# Patient Record
Sex: Male | Born: 1970 | Race: White | Hispanic: No | Marital: Married | Smoking: Former smoker
Health system: Southern US, Community
[De-identification: ages and names within clinical notes are randomized; demographics above are authoritative.]

## PROBLEM LIST (undated history)

## (undated) DIAGNOSIS — E119 Type 2 diabetes mellitus without complications: Secondary | ICD-10-CM

## (undated) DIAGNOSIS — N529 Male erectile dysfunction, unspecified: Secondary | ICD-10-CM

## (undated) DIAGNOSIS — R12 Heartburn: Secondary | ICD-10-CM

## (undated) DIAGNOSIS — G4730 Sleep apnea, unspecified: Secondary | ICD-10-CM

## (undated) HISTORY — DX: Sleep apnea, unspecified: G47.30

## (undated) HISTORY — PX: VASECTOMY: SHX75

## (undated) HISTORY — DX: Male erectile dysfunction, unspecified: N52.9

## (undated) HISTORY — DX: Heartburn: R12

## (undated) HISTORY — DX: Type 2 diabetes mellitus without complications: E11.9

---

## 2016-09-04 ENCOUNTER — Ambulatory Visit (INDEPENDENT_AMBULATORY_CARE_PROVIDER_SITE_OTHER): Payer: BC Managed Care – PPO | Admitting: Urology

## 2016-09-04 DIAGNOSIS — N5201 Erectile dysfunction due to arterial insufficiency: Secondary | ICD-10-CM | POA: Diagnosis not present

## 2016-09-04 DIAGNOSIS — Z302 Encounter for sterilization: Secondary | ICD-10-CM | POA: Diagnosis not present

## 2016-09-25 ENCOUNTER — Encounter: Payer: Self-pay | Admitting: *Deleted

## 2016-09-26 ENCOUNTER — Encounter: Payer: Self-pay | Admitting: Cardiovascular Disease

## 2016-09-26 ENCOUNTER — Ambulatory Visit (INDEPENDENT_AMBULATORY_CARE_PROVIDER_SITE_OTHER): Payer: BC Managed Care – PPO | Admitting: Cardiovascular Disease

## 2016-09-26 VITALS — BP 132/84 | HR 84 | Ht 71.0 in | Wt 243.0 lb

## 2016-09-26 DIAGNOSIS — Z7182 Exercise counseling: Secondary | ICD-10-CM | POA: Diagnosis not present

## 2016-09-26 DIAGNOSIS — E119 Type 2 diabetes mellitus without complications: Secondary | ICD-10-CM

## 2016-09-26 DIAGNOSIS — N521 Erectile dysfunction due to diseases classified elsewhere: Secondary | ICD-10-CM

## 2016-09-26 DIAGNOSIS — E782 Mixed hyperlipidemia: Secondary | ICD-10-CM | POA: Diagnosis not present

## 2016-09-26 DIAGNOSIS — Z136 Encounter for screening for cardiovascular disorders: Secondary | ICD-10-CM | POA: Diagnosis not present

## 2016-09-26 DIAGNOSIS — Z9189 Other specified personal risk factors, not elsewhere classified: Secondary | ICD-10-CM | POA: Diagnosis not present

## 2016-09-26 NOTE — Patient Instructions (Signed)
Medication Instructions:  Continue all current medications.  Labwork: none  Testing/Procedures: none  Follow-Up: As needed.    Any Other Special Instructions Will Be Listed Below (If Applicable).  If you need a refill on your cardiac medications before your next appointment, please call your pharmacy.  

## 2016-09-26 NOTE — Progress Notes (Signed)
CARDIOLOGY CONSULT NOTE  Patient ID: Luis York MRN: [id number] DOB/AGE: Jul 08, 1970 46 y.o.  Admit date: (Not on file) Primary Physician: Ignatius Specking, MD Referring Physician: Wilkie Aye MD  Reason for Consultation: CAD assessment  HPI: Luis York is a 46 y.o. male who is being seen today for the evaluation of CAD assessment at the request of Wilkie Aye, MD.  He was evaluated by his urologist for vasectomy (scheduled for 10/16/16) and has been noted to have erectile dysfunction. He has diabetes and hyperlipidemia. He quit smoking in 1998.  ECG performed in the office today which I ordered and personally interpreted demonstrates normal sinus rhythm with no ischemic ST segment or T-wave abnormalities, nor any arrhythmias.  In November 2016 his HbA1c was noted to be 12.6%. After the institution of metformin, he was able to reduce this to 6.1-6.5%.  In the past 2 years he has noticed decreased nocturnal erections. Testosterone level was reportedly normal at 418.  He also has sleep apnea and has been using CPAP for roughly 10 years.  He walks a mile on a treadmill and roughly 20 minutes and uses an incline. He has 13 steps at home and can climb it up and down several times a day without exertional chest pain or shortness of breath. He also denies orthopnea, palpitations, leg swelling, and paroxysmal nocturnal dyspnea.  He occasionally has some right hand numbness. He denies lower extremity neuropathy.  He may only experiences exertional dyspnea if he is doing heavy lifting and working out in the yard but has no functional limitations.  He said he is lazy and plays video games. He works in [employer]ing [employer]. He also plays pool.    Not on File  Current Outpatient Prescriptions  Medication Sig Dispense Refill  . metFORMIN (GLUCOPHAGE) 500 MG tablet Take 500 mg by mouth daily.    . Omeprazole (PRILOSEC PO) Take by mouth daily.    . simvastatin (ZOCOR) 5 MG tablet Take 5 mg by mouth  daily.     No current facility-administered medications for this visit.     Past Medical History:  Diagnosis Date  . Diabetes mellitus without complication (HCC)   . Erectile dysfunction   . Heartburn   . Sleep apnea     History reviewed. No pertinent surgical history.  Social History   Social History  . Marital status: Married    Spouse name: N/A  . Number of children: N/A  . Years of education: N/A   Occupational History  . Not on file.   Social History Main Topics  . Smoking status: Former Smoker    Quit date: 08/08/1996  . Smokeless tobacco: Never Used  . Alcohol use No  . Drug use: Unknown  . Sexual activity: Not on file   Other Topics Concern  . Not on file   Social History Narrative  . No narrative on file     No family history of premature CAD in 1st degree relatives.  No outpatient prescriptions have been marked as taking for the 09/26/16 encounter (Office Visit) with Laqueta Linden, MD.      Review of systems complete and found to be negative unless listed above in HPI    Physical exam Blood pressure 132/84, pulse 84, height  (1.803 m), weight 243 lb (110.2 kg), SpO2 97 %. General: NAD Neck: No JVD, no thyromegaly or thyroid nodule.  Lungs: Clear to auscultation bilaterally with normal respiratory effort. CV: Nondisplaced PMI. Regular  rate and rhythm, normal S1/S2, no S3/S4, no murmur.  No peripheral edema.  No carotid bruit.    Abdomen: Soft, nontender, obese, no distention.  Skin: Intact without lesions or rashes.  Neurologic: Alert and oriented x 3.  Psych: Normal affect. Extremities: No clubbing or cyanosis.  HEENT: Normal.   ECG: Most recent ECG reviewed.   Labs: No results found for: K, BUN, CREATININE, ALT, TSH, HGB   Lipids: No results found for: LDLCALC, LDLDIRECT, CHOL, TRIG, HDL      ASSESSMENT AND PLAN:  1. 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 He follows with urology. He is also scheduled for a vasectomy on May 9.  2. CAD risk assessment: He has no functional limitations. His ECG is without ischemic ST segment or T-wave abnormalities, nor any arrhythmias. He can easily achieve 4 METS without difficulty. Cardiovascular exam is unremarkable. I do not feel noninvasive cardiovascular testing is indicated at this time. I had a lengthy discussion with him about the primary prevention of cardiovascular disease. I spoke to him about dietary and exercise modification.  3. Hyperlipidemia: Continue statin therapy.  4. Type 2 diabetes mellitus: Appears to be controlled with dietary modification and metformin. I advocated for increased physical activity.  Disposition: Follow up prn Signed: Prentice Docker, M.D., F.A.C.C.  09/26/2016, 8:51 AM

## 2016-10-16 ENCOUNTER — Encounter (INDEPENDENT_AMBULATORY_CARE_PROVIDER_SITE_OTHER): Payer: BC Managed Care – PPO | Admitting: Urology

## 2016-10-16 DIAGNOSIS — Z302 Encounter for sterilization: Secondary | ICD-10-CM | POA: Diagnosis not present

## 2016-10-16 DIAGNOSIS — N5201 Erectile dysfunction due to arterial insufficiency: Secondary | ICD-10-CM | POA: Diagnosis not present

## 2016-10-29 ENCOUNTER — Ambulatory Visit (INDEPENDENT_AMBULATORY_CARE_PROVIDER_SITE_OTHER): Payer: Self-pay | Admitting: Urology

## 2016-10-29 DIAGNOSIS — N50811 Right testicular pain: Secondary | ICD-10-CM

## 2016-11-13 ENCOUNTER — Other Ambulatory Visit: Payer: Self-pay | Admitting: Urology

## 2016-11-13 ENCOUNTER — Ambulatory Visit (HOSPITAL_COMMUNITY)
Admission: RE | Admit: 2016-11-13 | Discharge: 2016-11-13 | Disposition: A | Discharge status: Home / Self Care | Payer: BC Managed Care – PPO | Source: Ambulatory Visit | Attending: Urology | Admitting: Urology

## 2016-11-13 ENCOUNTER — Ambulatory Visit (INDEPENDENT_AMBULATORY_CARE_PROVIDER_SITE_OTHER): Payer: Self-pay | Admitting: Urology

## 2016-11-13 DIAGNOSIS — N453 Epididymo-orchitis: Secondary | ICD-10-CM | POA: Diagnosis present

## 2016-11-13 DIAGNOSIS — N50811 Right testicular pain: Secondary | ICD-10-CM

## 2016-11-13 DIAGNOSIS — N509 Disorder of male genital organs, unspecified: Secondary | ICD-10-CM | POA: Diagnosis not present

## 2016-12-18 ENCOUNTER — Ambulatory Visit (INDEPENDENT_AMBULATORY_CARE_PROVIDER_SITE_OTHER): Payer: Self-pay | Admitting: Urology

## 2016-12-18 DIAGNOSIS — R102 Pelvic and perineal pain: Secondary | ICD-10-CM

## 2017-03-19 ENCOUNTER — Ambulatory Visit (INDEPENDENT_AMBULATORY_CARE_PROVIDER_SITE_OTHER): Payer: BC Managed Care – PPO | Admitting: Urology

## 2017-03-19 DIAGNOSIS — R102 Pelvic and perineal pain: Secondary | ICD-10-CM

## 2017-03-19 DIAGNOSIS — N5201 Erectile dysfunction due to arterial insufficiency: Secondary | ICD-10-CM | POA: Diagnosis not present

## 2017-07-02 ENCOUNTER — Ambulatory Visit: Payer: BC Managed Care – PPO | Admitting: Urology

## 2017-07-02 DIAGNOSIS — N5201 Erectile dysfunction due to arterial insufficiency: Secondary | ICD-10-CM

## 2017-07-02 DIAGNOSIS — R3912 Poor urinary stream: Secondary | ICD-10-CM

## 2017-12-24 ENCOUNTER — Ambulatory Visit: Payer: BC Managed Care – PPO | Admitting: Urology

## 2017-12-24 DIAGNOSIS — N5201 Erectile dysfunction due to arterial insufficiency: Secondary | ICD-10-CM | POA: Diagnosis not present

## 2017-12-24 DIAGNOSIS — R3912 Poor urinary stream: Secondary | ICD-10-CM

## 2018-03-07 IMAGING — US US ART/VEN ABD/PELV/SCROTUM DOPPLER LTD
1 series · 14 of 25 positions shown · non-contrast
Comparison: No prior.

CLINICAL DATA: Epididymitis.  Orchitis.

EXAM:
SCROTAL ULTRASOUND
DOPPLER ULTRASOUND OF THE TESTICLES
TECHNIQUE: Complete ultrasound examination of the testicles, epididymis, and
other scrotal structures was performed. Color and spectral Doppler
ultrasound were also utilized to evaluate blood flow to the
testicles.

[Series 1: us art/ven abd/pelv/scrotum doppler ltd · 0.06mm/px · 14 of 99 slices shown]
[im 1/99]
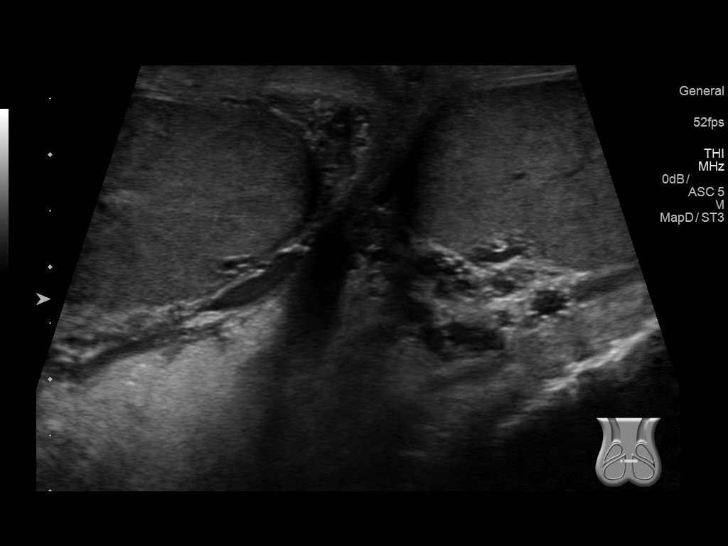
[im 9/99]
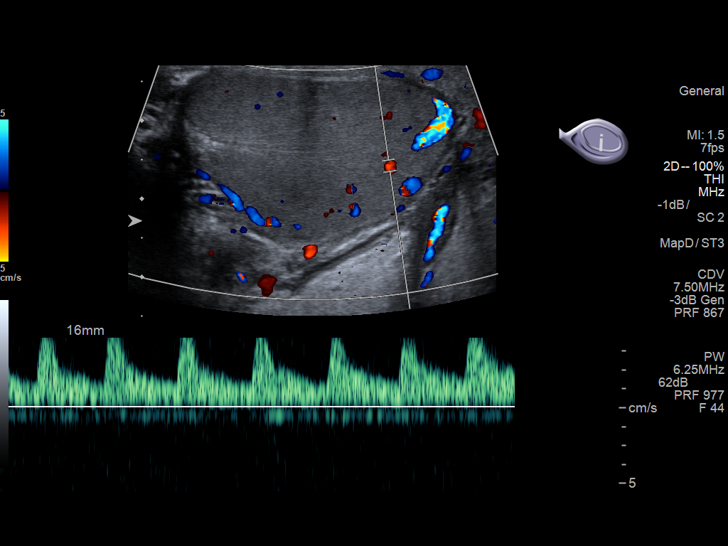
[im 17/99]
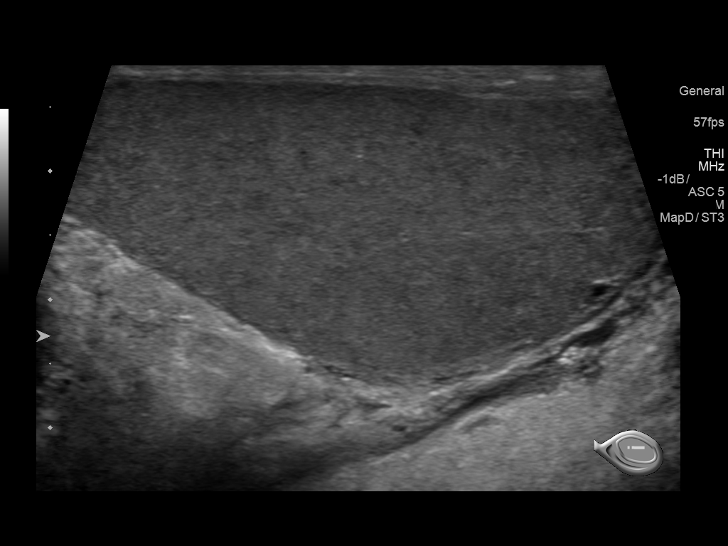
[im 25/99]
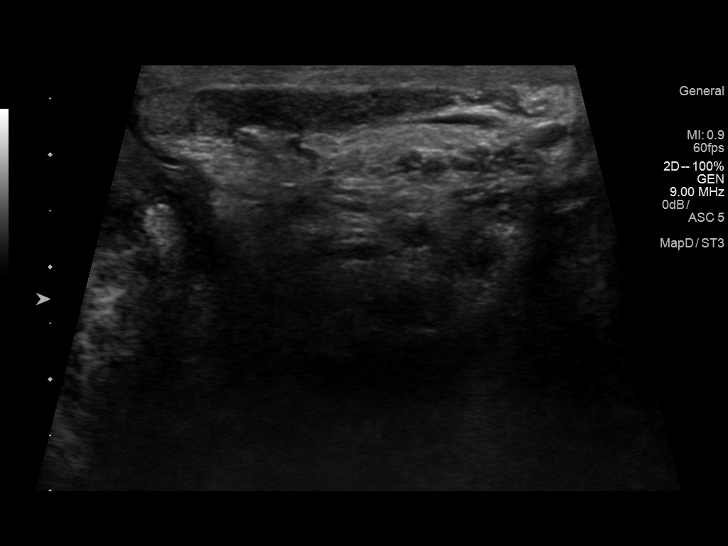
[im 33/99]
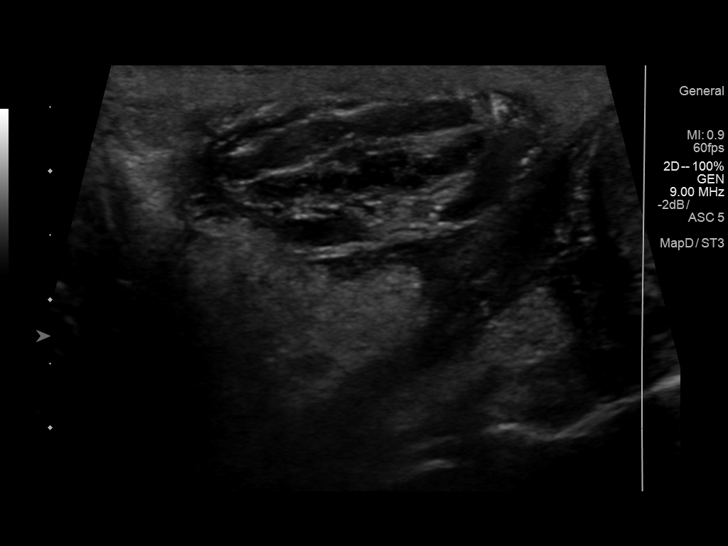
[im 37/99]
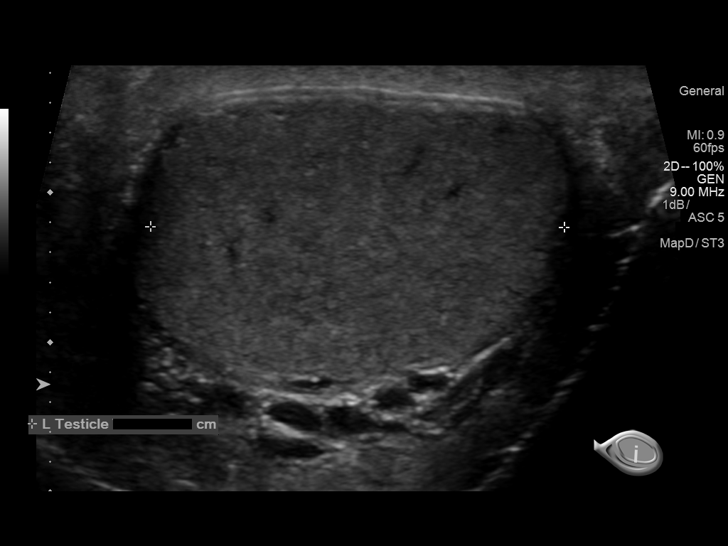
[im 45/99]
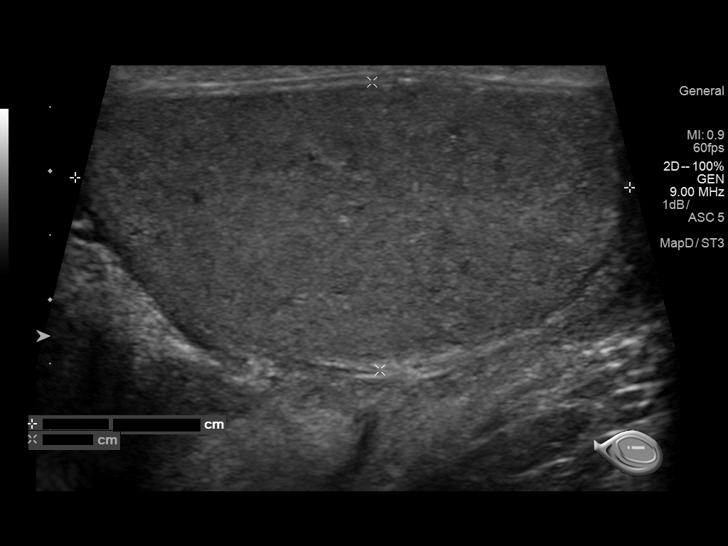
[im 54/99]
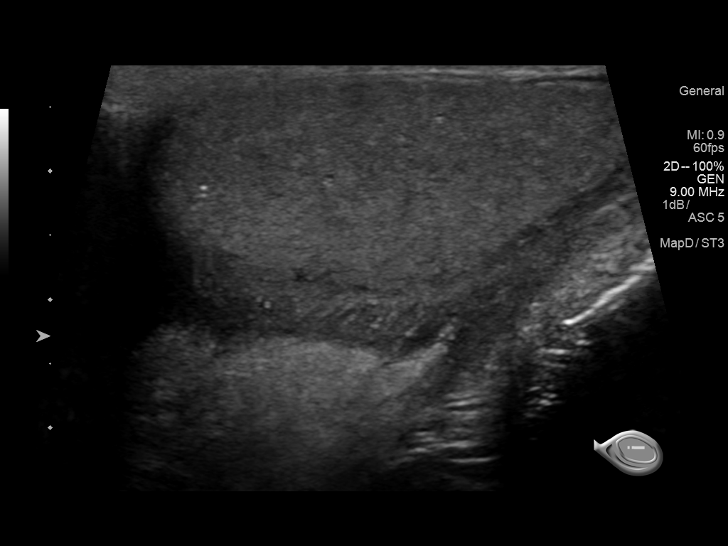
[im 62/99]
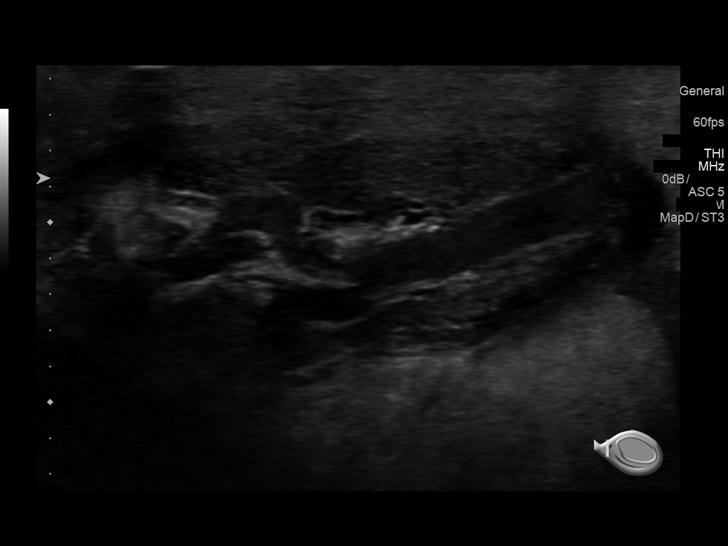
[im 66/99]
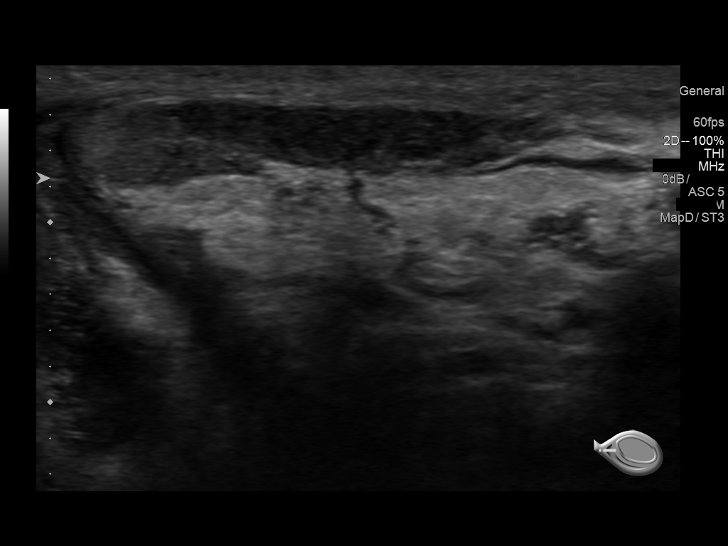
[im 74/99]
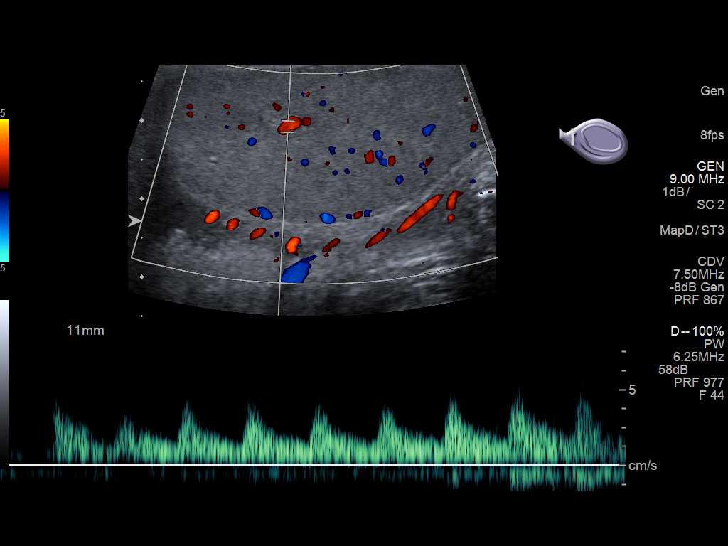
[im 82/99]
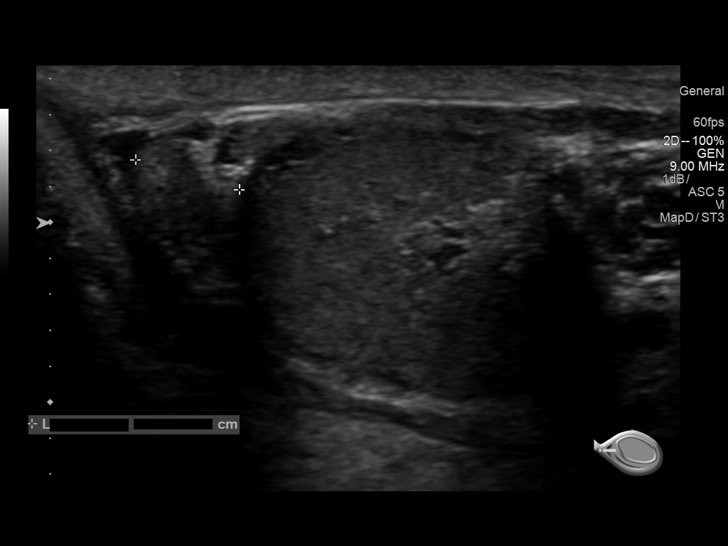
[im 90/99]
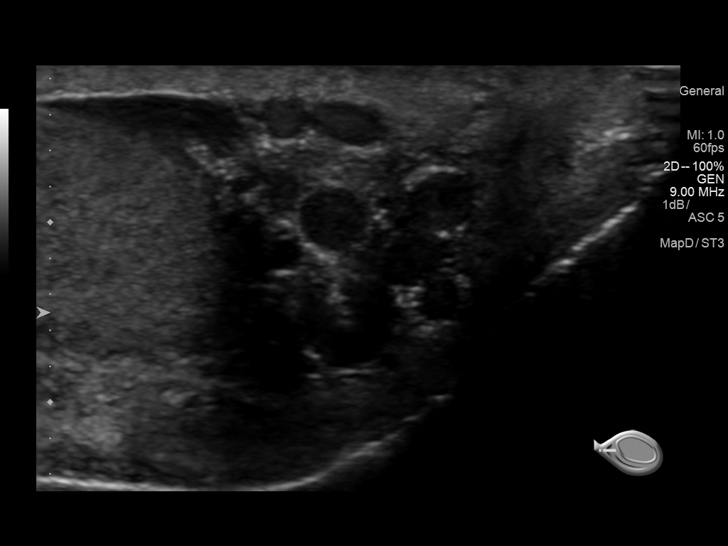
[im 99/99]
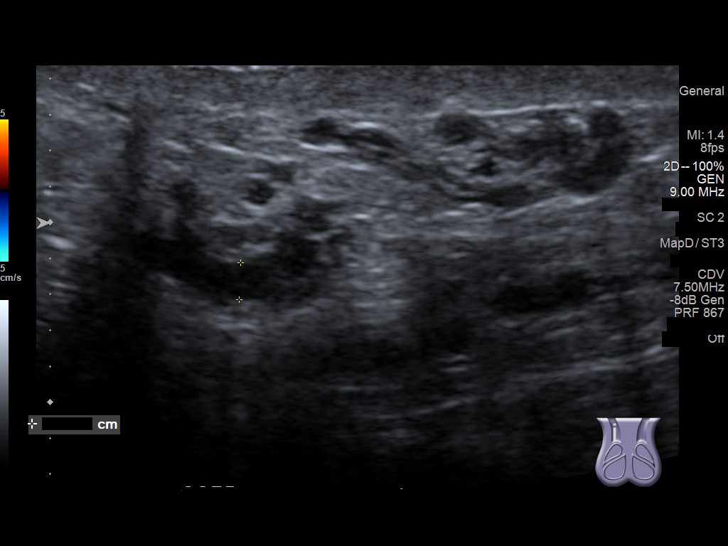

[14 of 25 positions shown; findings below may reference images not displayed]

FINDINGS: Right testicle

Measurements: 4.8 x 2.3 x 3.6 cm. No mass or microlithiasis
visualized.

Left testicle

Measurements: 4.3 x 2.2 x 2.8 cm. No mass or microlithiasis
visualized.

Right epididymis:  Normal in size and appearance.

Left epididymis:  Normal in size and appearance.

Hydrocele: Complex density is noted about the right testicle. This
could represent a small complex hydrocele from infection or
hemorrhage.

Varicocele:  None visualized.

Pulsed Doppler interrogation of both testes demonstrates normal low
resistance arterial and venous waveforms bilaterally.
IMPRESSION: Complex density noted about the right testicle. This could represent
a small complex hydrocele from infection or hemorrhage. No evidence
of testicular torsion or mass lesion.

## 2018-07-01 ENCOUNTER — Ambulatory Visit: Payer: BC Managed Care – PPO | Admitting: Urology

## 2018-07-01 DIAGNOSIS — N5201 Erectile dysfunction due to arterial insufficiency: Secondary | ICD-10-CM | POA: Diagnosis not present

## 2018-07-01 DIAGNOSIS — R3912 Poor urinary stream: Secondary | ICD-10-CM | POA: Diagnosis not present

## 2019-06-01 ENCOUNTER — Encounter: Payer: Self-pay | Admitting: Urology

## 2019-08-04 ENCOUNTER — Ambulatory Visit (INDEPENDENT_AMBULATORY_CARE_PROVIDER_SITE_OTHER): Payer: BC Managed Care – PPO | Admitting: Urology

## 2019-08-04 ENCOUNTER — Encounter: Payer: Self-pay | Admitting: Urology

## 2019-08-04 ENCOUNTER — Other Ambulatory Visit: Payer: Self-pay

## 2019-08-04 VITALS — BP 105/103 | HR 94 | Temp 97.3°F | Ht 71.0 in | Wt 245.0 lb

## 2019-08-04 DIAGNOSIS — N5201 Erectile dysfunction due to arterial insufficiency: Secondary | ICD-10-CM | POA: Diagnosis not present

## 2019-08-04 DIAGNOSIS — R3911 Hesitancy of micturition: Secondary | ICD-10-CM | POA: Diagnosis not present

## 2019-08-04 MED ORDER — SILDENAFIL CITRATE 20 MG PO TABS: 20.0000 mg | ORAL_TABLET | ORAL | 5 refills | Status: DC | PRN

## 2019-08-04 NOTE — Patient Instructions (Signed)
Erectile Dysfunction Erectile dysfunction (ED) is the inability to get or keep an erection in order to have sexual intercourse. Erectile dysfunction may include:  Inability to get an erection.  Lack of enough hardness of the erection to allow penetration.  Loss of the erection before sex is finished. What are the causes? This condition may be caused by:  Certain medicines, such as: ? Pain relievers. ? Antihistamines. ? Antidepressants. ? Blood pressure medicines. ? Water pills (diuretics). ? Ulcer medicines. ? Muscle relaxants. ? Drugs.  Excessive drinking.  Psychological causes, such as: ? Anxiety. ? Depression. ? Sadness. ? Exhaustion. ? Performance fear. ? Stress.  Physical causes, such as: ? Artery problems. This may include diabetes, smoking, liver disease, or atherosclerosis. ? High blood pressure. ? Hormonal problems, such as low testosterone. ? Obesity. ? Nerve problems. This may include back or pelvic injuries, diabetes mellitus, multiple sclerosis, or Parkinson disease. What are the signs or symptoms? Symptoms of this condition include:  Inability to get an erection.  Lack of enough hardness of the erection to allow penetration.  Loss of the erection before sex is finished.  Normal erections at some times, but with frequent unsatisfactory episodes.  Low sexual satisfaction in either partner due to erection problems.  A curved penis occurring with erection. The curve may cause pain or the penis may be too curved to allow for intercourse.  Never having nighttime erections. How is this diagnosed? This condition is often diagnosed by:  Performing a physical exam to find other diseases or specific problems with the penis.  Asking you detailed questions about the problem.  Performing blood tests to check for diabetes mellitus or to measure hormone levels.  Performing other tests to check for underlying health conditions.  Performing an ultrasound  exam to check for scarring.  Performing a test to check blood flow to the penis.  Doing a sleep study at home to measure nighttime erections. How is this treated? This condition may be treated by:  Medicine taken by mouth to help you achieve an erection (oral medicine).  Hormone replacement therapy to replace low testosterone levels.  Medicine that is injected into the penis. Your health care provider may instruct you how to give yourself these injections at home.  Vacuum pump. This is a pump with a ring on it. The pump and ring are placed on the penis and used to create pressure that helps the penis become erect.  Penile implant surgery. In this procedure, you may receive: ? An inflatable implant. This consists of cylinders, a pump, and a reservoir. The cylinders can be inflated with a fluid that helps to create an erection, and they can be deflated after intercourse. ? A semi-rigid implant. This consists of two silicone rubber rods. The rods provide some rigidity. They are also flexible, so the penis can both curve downward in its normal position and become straight for sexual intercourse.  Blood vessel surgery, to improve blood flow to the penis. During this procedure, a blood vessel from a different part of the body is placed into the penis to allow blood to flow around (bypass) damaged or blocked blood vessels.  Lifestyle changes, such as exercising more, losing weight, and quitting smoking. Follow these instructions at home: Medicines   Take over-the-counter and prescription medicines only as told by your health care provider. Do not increase the dosage without first discussing it with your health care provider.  If you are using self-injections, perform injections as directed by your   health care provider. Make sure to avoid any veins that are on the surface of the penis. After giving an injection, apply pressure to the injection site for 5 minutes. General  instructions  Exercise regularly, as directed by your health care provider. Work with your health care provider to lose weight, if needed.  Do not use any products that contain nicotine or tobacco, such as cigarettes and e-cigarettes. If you need help quitting, ask your health care provider.  Before using a vacuum pump, read the instructions that come with the pump and discuss any questions with your health care provider.  Keep all follow-up visits as told by your health care provider. This is important. Contact a health care provider if:  You feel nauseous.  You vomit. Get help right away if:  You are taking oral or injectable medicines and you have an erection that lasts longer than 4 hours. If your health care provider is unavailable, go to the nearest emergency room for evaluation. An erection that lasts much longer than 4 hours can result in permanent damage to your penis.  You have severe pain in your groin or abdomen.  You develop redness or severe swelling of your penis.  You have redness spreading up into your groin or lower abdomen.  You are unable to urinate.  You experience chest pain or a rapid heart beat (palpitations) after taking oral medicines. Summary  Erectile dysfunction (ED) is the inability to get or keep an erection during sexual intercourse. This problem can usually be treated successfully.  This condition is diagnosed based on a physical exam, your symptoms, and tests to determine the cause. Treatment varies depending on the cause, and may include medicines, hormone therapy, surgery, or vacuum pump.  You may need follow-up visits to make sure that you are using your medicines or devices correctly.  Get help right away if you are taking or injecting medicines and you have an erection that lasts longer than 4 hours. This information is not intended to replace advice given to you by your health care provider. Make sure you discuss any questions you have with  your health care provider. Document Revised: 05/09/2017 Document Reviewed: 06/12/2016 Elsevier Patient Education  2020 Elsevier Inc.  

## 2019-08-04 NOTE — Progress Notes (Signed)

## 2019-08-04 NOTE — Progress Notes (Signed)
08/04/2019 9:16 AM   Luis York April 07, 1971 [id number]  Referring provider: Glenda Chroman, MD [place],  [place] [address]  Ed and urinary hesitancy  HPI: Luis York is a 49yo here for followup for ED and urinary hesitancy. He has DMII which is under good control. He takes metformin. He has stable urinary hesitancy which was initially improved with DMII control. No other LUTS. Good stream.  He has stable ED which he takes sildenafil prn with good results. He can sometimes get an erection without medication. He continues to have issues maintaining an erection. He is interested in [place] treatment.    His records from [facility] are as follows: 09/04/2016: For the past 2 years he has been having difficulty getting and maintaining an erection. 2 years ago his A1c was 12. It now down to 6.5. Per patent he had a testosterone level which was normal. He has HLD and takes simvastatin.  He has tried 50mg  viagra. Rare nocturnal erections.  He gets up once at night.  10/16/2016: Pt saw cardiology and did not have any EKG changes and nio concern for active CAD  03/19/2017: He has tried sildenafil 20mg  which gave him a semifirm erection  07/02/2017: He has tried sildenafil 40-80mg  with mixed results. Most of the erections are semifirm. no issues with libido. testosterone was normal at 418 in 08/2016. His blood sugars have been 160-215 since Oct.  12/24/2017: He has been using sildenafil prn with mixed results. He did not get his rx for cialis filled due to cost.  07/01/2018: He is using tadalafil 20mg  prn with good results.    PMH: Past Medical History:  Diagnosis Date  . Diabetes mellitus without complication ([place])   . Erectile dysfunction   . Heartburn   . Sleep apnea     Surgical History: No past surgical history on file.  Home Medications:  Allergies as of 08/04/2019   Not on File     Medication List       Accurate as of August 04, 2019  9:16 AM. If you have any questions, ask your nurse  or doctor.        STOP taking these medications   PRILOSEC PO Stopped by: Nicolette Bang, MD     TAKE these medications   glipiZIDE-metformin 5-500 MG tablet Commonly known as: METAGLIP Take 0.5 tablets by mouth 2 (two) times daily.   metFORMIN 500 MG tablet Commonly known as: GLUCOPHAGE Take 500 mg by mouth daily.   omeprazole 20 MG capsule Commonly known as: PRILOSEC Take 20 mg by mouth daily.   OneTouch Verio test strip Generic drug: glucose blood 1 each 2 (two) times daily.   rosuvastatin 10 MG tablet Commonly known as: CRESTOR Take 10 mg by mouth daily.   sildenafil 20 MG tablet Commonly known as: REVATIO SMARTSIG:3 Tablet(s) By Mouth Every Other Day PRN   simvastatin 5 MG tablet Commonly known as: ZOCOR Take 5 mg by mouth daily.       Allergies: Not on File  Family History: Family History  Problem Relation Age of Onset  . Heart disease Mother   . Diabetes Mother   . Heart attack Father   . High blood pressure Father   . Diabetes Father   . Stroke Paternal Grandmother   . Heart disease Paternal Grandmother     Social History:  reports that he quit smoking about 23 years ago. He has never used smokeless tobacco. He reports that he does not drink  alcohol. No history on file for drug.  ROS: All other review of systems were reviewed and are negative except what is noted above in HPI  Physical Exam: BP (!) 105/103   Pulse 94   Temp (!) 97.3 F (36.3 C)   Ht 5\' 11"  (1.803 m)   Wt 245 lb (111.1 kg)   BMI 34.17 kg/m   Constitutional:  Alert and oriented, No acute distress. HEENT: [place] AT, moist mucus membranes.  Trachea midline, no masses. Cardiovascular: No clubbing, cyanosis, or edema. Respiratory: Normal respiratory effort, no increased work of breathing. GI: Abdomen is soft, nontender, nondistended, no abdominal masses GU: No CVA tenderness Lymph: No cervical or inguinal lymphadenopathy. Skin: No rashes, bruises or suspicious  lesions. Neurologic: Grossly intact, no focal deficits, moving all 4 extremities. Psychiatric: Normal mood and affect.  Laboratory Data: No results found for: WBC, HGB, HCT, MCV, PLT  No results found for: CREATININE  No results found for: PSA  No results found for: TESTOSTERONE  No results found for: HGBA1C  Urinalysis No results found for: COLORURINE, APPEARANCEUR, LABSPEC, PHURINE, GLUCOSEU, HGBUR, BILIRUBINUR, KETONESUR, PROTEINUR, UROBILINOGEN, NITRITE, LEUKOCYTESUR  No results found for: LABMICR, WBCUA, RBCUA, LABEPIT, MUCUS, BACTERIA  Pertinent Imaging:  No results found for this or any previous visit. No results found for this or any previous visit. No results found for this or any previous visit. No results found for this or any previous visit. No results found for this or any previous visit. No results found for this or any previous visit. No results found for this or any previous visit. No results found for this or any previous visit.  Assessment & Plan:    1. Erectile dysfunction due to arterial insufficiency -continue sildenafil prn -We discussed FIRM-Est therapy and the patient will call [facility] to schedule  2. Urinary hesitancy -Continue good DMII control   No follow-ups on file.  , MD  [facility] [place]

## 2019-10-19 ENCOUNTER — Other Ambulatory Visit: Payer: Self-pay | Admitting: Urology

## 2020-02-10 ENCOUNTER — Other Ambulatory Visit: Payer: Self-pay

## 2020-02-10 ENCOUNTER — Other Ambulatory Visit: Payer: Self-pay | Admitting: Urology

## 2020-02-10 ENCOUNTER — Other Ambulatory Visit: Payer: BC Managed Care – PPO

## 2020-02-10 DIAGNOSIS — Z20822 Contact with and (suspected) exposure to covid-19: Secondary | ICD-10-CM

## 2020-02-11 ENCOUNTER — Other Ambulatory Visit: Payer: Self-pay

## 2020-02-12 LAB — NOVEL CORONAVIRUS, NAA: SARS-CoV-2, NAA: NOT DETECTED

## 2020-02-15 MED ORDER — SILDENAFIL CITRATE 20 MG PO TABS: 20.0000 mg | ORAL_TABLET | ORAL | 0 refills | Status: DC | PRN

## 2020-02-17 ENCOUNTER — Other Ambulatory Visit: Payer: BC Managed Care – PPO

## 2020-02-17 ENCOUNTER — Other Ambulatory Visit: Payer: Self-pay

## 2020-02-17 DIAGNOSIS — Z20822 Contact with and (suspected) exposure to covid-19: Secondary | ICD-10-CM

## 2020-02-19 LAB — NOVEL CORONAVIRUS, NAA: SARS-CoV-2, NAA: NOT DETECTED

## 2020-02-19 LAB — SARS-COV-2, NAA 2 DAY TAT

## 2020-02-24 ENCOUNTER — Other Ambulatory Visit: Payer: BC Managed Care – PPO

## 2020-02-24 ENCOUNTER — Other Ambulatory Visit: Payer: Self-pay

## 2020-02-24 DIAGNOSIS — Z20822 Contact with and (suspected) exposure to covid-19: Secondary | ICD-10-CM

## 2020-02-26 LAB — SARS-COV-2, NAA 2 DAY TAT

## 2020-02-26 LAB — NOVEL CORONAVIRUS, NAA: SARS-CoV-2, NAA: NOT DETECTED

## 2020-03-02 ENCOUNTER — Other Ambulatory Visit: Payer: BC Managed Care – PPO

## 2020-03-02 DIAGNOSIS — Z20822 Contact with and (suspected) exposure to covid-19: Secondary | ICD-10-CM

## 2020-03-04 LAB — SARS-COV-2, NAA 2 DAY TAT

## 2020-03-04 LAB — NOVEL CORONAVIRUS, NAA: SARS-CoV-2, NAA: NOT DETECTED

## 2020-03-09 ENCOUNTER — Other Ambulatory Visit: Payer: BC Managed Care – PPO

## 2020-03-09 DIAGNOSIS — Z20822 Contact with and (suspected) exposure to covid-19: Secondary | ICD-10-CM

## 2020-03-10 LAB — SARS-COV-2, NAA 2 DAY TAT

## 2020-03-10 LAB — NOVEL CORONAVIRUS, NAA: SARS-CoV-2, NAA: NOT DETECTED

## 2020-03-16 ENCOUNTER — Other Ambulatory Visit: Payer: BC Managed Care – PPO

## 2020-03-16 DIAGNOSIS — Z20822 Contact with and (suspected) exposure to covid-19: Secondary | ICD-10-CM

## 2020-03-18 LAB — NOVEL CORONAVIRUS, NAA: SARS-CoV-2, NAA: NOT DETECTED

## 2020-03-18 LAB — SARS-COV-2, NAA 2 DAY TAT

## 2020-03-23 ENCOUNTER — Other Ambulatory Visit: Payer: BC Managed Care – PPO

## 2020-03-23 DIAGNOSIS — Z20822 Contact with and (suspected) exposure to covid-19: Secondary | ICD-10-CM

## 2020-03-24 LAB — SARS-COV-2, NAA 2 DAY TAT

## 2020-03-24 LAB — NOVEL CORONAVIRUS, NAA: SARS-CoV-2, NAA: NOT DETECTED

## 2020-03-30 ENCOUNTER — Other Ambulatory Visit: Payer: BC Managed Care – PPO

## 2020-03-30 DIAGNOSIS — Z20822 Contact with and (suspected) exposure to covid-19: Secondary | ICD-10-CM

## 2020-03-31 LAB — NOVEL CORONAVIRUS, NAA: SARS-CoV-2, NAA: NOT DETECTED

## 2020-03-31 LAB — SARS-COV-2, NAA 2 DAY TAT

## 2020-04-06 ENCOUNTER — Other Ambulatory Visit: Payer: BC Managed Care – PPO

## 2020-04-06 DIAGNOSIS — Z20822 Contact with and (suspected) exposure to covid-19: Secondary | ICD-10-CM

## 2020-04-07 LAB — SARS-COV-2, NAA 2 DAY TAT

## 2020-04-07 LAB — NOVEL CORONAVIRUS, NAA: SARS-CoV-2, NAA: NOT DETECTED

## 2020-04-13 ENCOUNTER — Other Ambulatory Visit: Payer: BC Managed Care – PPO

## 2020-04-13 DIAGNOSIS — Z20822 Contact with and (suspected) exposure to covid-19: Secondary | ICD-10-CM

## 2020-04-14 LAB — SARS-COV-2, NAA 2 DAY TAT

## 2020-04-14 LAB — NOVEL CORONAVIRUS, NAA: SARS-CoV-2, NAA: NOT DETECTED

## 2020-04-20 ENCOUNTER — Other Ambulatory Visit: Payer: BC Managed Care – PPO

## 2020-04-20 DIAGNOSIS — Z20822 Contact with and (suspected) exposure to covid-19: Secondary | ICD-10-CM

## 2020-04-21 LAB — NOVEL CORONAVIRUS, NAA: SARS-CoV-2, NAA: NOT DETECTED

## 2020-04-21 LAB — SARS-COV-2, NAA 2 DAY TAT

## 2020-04-27 ENCOUNTER — Other Ambulatory Visit: Payer: BC Managed Care – PPO

## 2020-04-27 DIAGNOSIS — Z20822 Contact with and (suspected) exposure to covid-19: Secondary | ICD-10-CM

## 2020-04-28 LAB — SARS-COV-2, NAA 2 DAY TAT

## 2020-04-28 LAB — NOVEL CORONAVIRUS, NAA: SARS-CoV-2, NAA: NOT DETECTED

## 2020-05-02 ENCOUNTER — Other Ambulatory Visit: Payer: BC Managed Care – PPO

## 2020-05-02 DIAGNOSIS — Z20822 Contact with and (suspected) exposure to covid-19: Secondary | ICD-10-CM

## 2020-05-04 LAB — NOVEL CORONAVIRUS, NAA: SARS-CoV-2, NAA: NOT DETECTED

## 2020-05-04 LAB — SARS-COV-2, NAA 2 DAY TAT

## 2020-05-05 ENCOUNTER — Telehealth: Payer: Self-pay | Admitting: Internal Medicine

## 2020-05-05 NOTE — Telephone Encounter (Signed)
Patient is calling to receive his COVID results. Patient expressed understanding

## 2020-05-11 ENCOUNTER — Other Ambulatory Visit: Payer: BC Managed Care – PPO

## 2020-05-11 DIAGNOSIS — Z20822 Contact with and (suspected) exposure to covid-19: Secondary | ICD-10-CM

## 2020-05-12 LAB — NOVEL CORONAVIRUS, NAA: SARS-CoV-2, NAA: NOT DETECTED

## 2020-05-12 LAB — SARS-COV-2, NAA 2 DAY TAT

## 2020-05-18 ENCOUNTER — Other Ambulatory Visit: Payer: BC Managed Care – PPO

## 2020-05-18 DIAGNOSIS — Z20822 Contact with and (suspected) exposure to covid-19: Secondary | ICD-10-CM

## 2020-05-19 LAB — NOVEL CORONAVIRUS, NAA: SARS-CoV-2, NAA: NOT DETECTED

## 2020-05-19 LAB — SARS-COV-2, NAA 2 DAY TAT

## 2020-05-25 ENCOUNTER — Other Ambulatory Visit: Payer: BC Managed Care – PPO

## 2020-05-25 DIAGNOSIS — Z20822 Contact with and (suspected) exposure to covid-19: Secondary | ICD-10-CM

## 2020-05-27 LAB — NOVEL CORONAVIRUS, NAA: SARS-CoV-2, NAA: NOT DETECTED

## 2020-05-27 LAB — SARS-COV-2, NAA 2 DAY TAT

## 2020-06-01 ENCOUNTER — Other Ambulatory Visit: Payer: BC Managed Care – PPO

## 2020-06-01 DIAGNOSIS — Z20822 Contact with and (suspected) exposure to covid-19: Secondary | ICD-10-CM

## 2020-06-03 LAB — NOVEL CORONAVIRUS, NAA: SARS-CoV-2, NAA: NOT DETECTED

## 2020-06-03 LAB — SARS-COV-2, NAA 2 DAY TAT

## 2020-06-08 ENCOUNTER — Other Ambulatory Visit: Payer: BC Managed Care – PPO

## 2020-06-08 DIAGNOSIS — Z20822 Contact with and (suspected) exposure to covid-19: Secondary | ICD-10-CM

## 2020-06-10 LAB — SARS-COV-2, NAA 2 DAY TAT

## 2020-06-10 LAB — NOVEL CORONAVIRUS, NAA: SARS-CoV-2, NAA: NOT DETECTED

## 2020-06-15 ENCOUNTER — Other Ambulatory Visit: Payer: Self-pay

## 2020-06-15 DIAGNOSIS — Z20822 Contact with and (suspected) exposure to covid-19: Secondary | ICD-10-CM

## 2020-06-17 LAB — SARS-COV-2, NAA 2 DAY TAT

## 2020-06-17 LAB — NOVEL CORONAVIRUS, NAA: SARS-CoV-2, NAA: NOT DETECTED

## 2020-06-22 ENCOUNTER — Other Ambulatory Visit: Payer: BC Managed Care – PPO

## 2020-06-22 ENCOUNTER — Other Ambulatory Visit: Payer: Self-pay

## 2020-06-22 DIAGNOSIS — Z20822 Contact with and (suspected) exposure to covid-19: Secondary | ICD-10-CM

## 2020-06-24 LAB — NOVEL CORONAVIRUS, NAA: SARS-CoV-2, NAA: NOT DETECTED

## 2020-06-24 LAB — SARS-COV-2, NAA 2 DAY TAT

## 2020-06-29 ENCOUNTER — Other Ambulatory Visit: Payer: Self-pay

## 2020-06-29 DIAGNOSIS — Z20822 Contact with and (suspected) exposure to covid-19: Secondary | ICD-10-CM

## 2020-07-01 LAB — NOVEL CORONAVIRUS, NAA: SARS-CoV-2, NAA: NOT DETECTED

## 2020-07-01 LAB — SARS-COV-2, NAA 2 DAY TAT

## 2020-07-06 ENCOUNTER — Other Ambulatory Visit: Payer: Self-pay

## 2020-07-06 DIAGNOSIS — Z20822 Contact with and (suspected) exposure to covid-19: Secondary | ICD-10-CM

## 2020-07-08 LAB — SARS-COV-2, NAA 2 DAY TAT

## 2020-07-08 LAB — NOVEL CORONAVIRUS, NAA: SARS-CoV-2, NAA: NOT DETECTED

## 2020-07-13 ENCOUNTER — Other Ambulatory Visit: Payer: Self-pay

## 2020-07-13 DIAGNOSIS — Z20822 Contact with and (suspected) exposure to covid-19: Secondary | ICD-10-CM

## 2020-07-14 LAB — SARS-COV-2, NAA 2 DAY TAT

## 2020-07-14 LAB — NOVEL CORONAVIRUS, NAA: SARS-CoV-2, NAA: NOT DETECTED

## 2020-07-20 ENCOUNTER — Other Ambulatory Visit: Payer: Self-pay

## 2020-07-20 DIAGNOSIS — Z20822 Contact with and (suspected) exposure to covid-19: Secondary | ICD-10-CM

## 2020-07-21 LAB — NOVEL CORONAVIRUS, NAA: SARS-CoV-2, NAA: NOT DETECTED

## 2020-07-21 LAB — SPECIMEN STATUS REPORT

## 2020-07-21 LAB — SARS-COV-2, NAA 2 DAY TAT

## 2020-07-27 ENCOUNTER — Other Ambulatory Visit: Payer: Self-pay

## 2020-07-27 DIAGNOSIS — Z20822 Contact with and (suspected) exposure to covid-19: Secondary | ICD-10-CM

## 2020-07-28 LAB — NOVEL CORONAVIRUS, NAA: SARS-CoV-2, NAA: NOT DETECTED

## 2020-07-28 LAB — SARS-COV-2, NAA 2 DAY TAT

## 2020-08-02 ENCOUNTER — Ambulatory Visit: Payer: BC Managed Care – PPO | Admitting: Urology

## 2020-08-03 ENCOUNTER — Other Ambulatory Visit: Payer: Self-pay

## 2020-08-03 DIAGNOSIS — Z20822 Contact with and (suspected) exposure to covid-19: Secondary | ICD-10-CM

## 2020-08-04 LAB — SARS-COV-2, NAA 2 DAY TAT

## 2020-08-04 LAB — NOVEL CORONAVIRUS, NAA: SARS-CoV-2, NAA: NOT DETECTED

## 2020-08-10 ENCOUNTER — Other Ambulatory Visit: Payer: Self-pay

## 2020-08-10 DIAGNOSIS — Z20822 Contact with and (suspected) exposure to covid-19: Secondary | ICD-10-CM

## 2020-08-11 LAB — SARS-COV-2, NAA 2 DAY TAT

## 2020-08-11 LAB — NOVEL CORONAVIRUS, NAA: SARS-CoV-2, NAA: NOT DETECTED

## 2020-08-17 ENCOUNTER — Other Ambulatory Visit: Payer: Self-pay

## 2020-08-18 ENCOUNTER — Other Ambulatory Visit: Payer: Self-pay

## 2020-08-18 DIAGNOSIS — Z20822 Contact with and (suspected) exposure to covid-19: Secondary | ICD-10-CM

## 2020-08-19 LAB — NOVEL CORONAVIRUS, NAA: SARS-CoV-2, NAA: NOT DETECTED

## 2020-08-19 LAB — SARS-COV-2, NAA 2 DAY TAT

## 2020-08-24 ENCOUNTER — Other Ambulatory Visit: Payer: Self-pay

## 2020-08-24 ENCOUNTER — Ambulatory Visit: Payer: Self-pay | Attending: Internal Medicine

## 2020-08-24 DIAGNOSIS — Z20822 Contact with and (suspected) exposure to covid-19: Secondary | ICD-10-CM

## 2020-08-25 LAB — NOVEL CORONAVIRUS, NAA: SARS-CoV-2, NAA: NOT DETECTED

## 2020-08-25 LAB — SARS-COV-2, NAA 2 DAY TAT

## 2020-08-31 ENCOUNTER — Other Ambulatory Visit: Payer: Self-pay

## 2020-08-31 ENCOUNTER — Ambulatory Visit: Payer: Self-pay | Attending: Critical Care Medicine

## 2020-08-31 DIAGNOSIS — Z20822 Contact with and (suspected) exposure to covid-19: Secondary | ICD-10-CM

## 2020-09-01 LAB — SARS-COV-2, NAA 2 DAY TAT

## 2020-09-01 LAB — NOVEL CORONAVIRUS, NAA: SARS-CoV-2, NAA: NOT DETECTED

## 2020-09-07 ENCOUNTER — Other Ambulatory Visit: Payer: Self-pay

## 2020-09-07 ENCOUNTER — Ambulatory Visit: Payer: BC Managed Care – PPO | Attending: Internal Medicine

## 2020-09-07 DIAGNOSIS — Z20822 Contact with and (suspected) exposure to covid-19: Secondary | ICD-10-CM

## 2020-09-08 ENCOUNTER — Ambulatory Visit (INDEPENDENT_AMBULATORY_CARE_PROVIDER_SITE_OTHER): Payer: BC Managed Care – PPO | Admitting: Urology

## 2020-09-08 ENCOUNTER — Encounter: Payer: Self-pay | Admitting: Urology

## 2020-09-08 ENCOUNTER — Other Ambulatory Visit: Payer: Self-pay

## 2020-09-08 VITALS — BP 106/75 | HR 75 | Temp 98.5°F | Ht 71.0 in | Wt 247.0 lb

## 2020-09-08 DIAGNOSIS — N5201 Erectile dysfunction due to arterial insufficiency: Secondary | ICD-10-CM

## 2020-09-08 LAB — URINALYSIS, ROUTINE W REFLEX MICROSCOPIC
Bilirubin, UA: NEGATIVE
Ketones, UA: NEGATIVE
Leukocytes,UA: NEGATIVE
Nitrite, UA: NEGATIVE
Protein,UA: NEGATIVE
RBC, UA: NEGATIVE
Specific Gravity, UA: 1.015 (ref 1.005–1.030)
Urobilinogen, Ur: 0.2 mg/dL (ref 0.2–1.0)
pH, UA: 5.5 (ref 5.0–7.5)

## 2020-09-08 LAB — SARS-COV-2, NAA 2 DAY TAT

## 2020-09-08 LAB — NOVEL CORONAVIRUS, NAA: SARS-CoV-2, NAA: NOT DETECTED

## 2020-09-08 MED ORDER — SILDENAFIL CITRATE 20 MG PO TABS: 20.0000 mg | ORAL_TABLET | ORAL | 3 refills | Status: DC | PRN

## 2020-09-08 NOTE — Progress Notes (Signed)
09/08/2020 9:49 AM   Luis York Nov 28, 1970 [id number]  Referring provider: Ignatius Specking, MD [address] [place],  [place] [address]  Chief Complaint  Patient presents with  . Follow-up    HPI: [name] is a 50yo here for followup for erectile dysfunction. He uses sildenafil 20mg  prn for for erectile dysfunction which works well. NO complaints today.    PMH: Past Medical History:  Diagnosis Date  . Diabetes mellitus without complication (HCC)   . Erectile dysfunction   . Heartburn   . Sleep apnea     Surgical History: No past surgical history on file.  Home Medications:  Allergies as of 09/08/2020   No Known Allergies     Medication List       Accurate as of September 08, 2020  9:49 AM. If you have any questions, ask your nurse or doctor.        STOP taking these medications   metFORMIN 500 MG tablet Commonly known as: GLUCOPHAGE Stopped by: September 10, 2020, MD   simvastatin 5 MG tablet Commonly known as: ZOCOR Stopped by: Wilkie Aye, MD     TAKE these medications   glipiZIDE-metformin 5-500 MG tablet Commonly known as: METAGLIP Take 0.5 tablets by mouth.   omeprazole 20 MG capsule Commonly known as: PRILOSEC Take 20 mg by mouth daily.   OneTouch Verio test strip Generic drug: glucose blood 1 each 2 (two) times daily.   rosuvastatin 10 MG tablet Commonly known as: CRESTOR Take 10 mg by mouth daily.   sildenafil 20 MG tablet Commonly known as: REVATIO Take 1 tablet (20 mg total) by mouth as needed.       Allergies: No Known Allergies  Family History: Family History  Problem Relation Age of Onset  . Heart disease Mother   . Diabetes Mother   . Heart attack Father   . High blood pressure Father   . Diabetes Father   . Stroke Paternal Grandmother   . Heart disease Paternal Grandmother     Social History:  reports that he quit smoking about 24 years ago. He has never used smokeless tobacco. He reports that he does not drink alcohol. No  history on file for drug use.  ROS: All other review of systems were reviewed and are negative except what is noted above in HPI  Physical Exam: BP 106/75   Pulse 75   Temp 98.5 F (36.9 C)   Ht 5\' 11"  (1.803 m)   Wt 247 lb (112 kg)   BMI 34.45 kg/m   Constitutional:  Alert and oriented, No acute distress. HEENT: [place] AT, moist mucus membranes.  Trachea midline, no masses. Cardiovascular: No clubbing, cyanosis, or edema. Respiratory: Normal respiratory effort, no increased work of breathing. GI: Abdomen is soft, nontender, nondistended, no abdominal masses GU: No CVA tenderness.  Lymph: No cervical or inguinal lymphadenopathy. Skin: No rashes, bruises or suspicious lesions. Neurologic: Grossly intact, no focal deficits, moving all 4 extremities. Psychiatric: Normal mood and affect.  Laboratory Data: No results found for: WBC, HGB, HCT, MCV, PLT  No results found for: CREATININE  No results found for: PSA  No results found for: TESTOSTERONE  No results found for: HGBA1C  Urinalysis No results found for: COLORURINE, APPEARANCEUR, LABSPEC, PHURINE, GLUCOSEU, HGBUR, BILIRUBINUR, KETONESUR, PROTEINUR, UROBILINOGEN, NITRITE, LEUKOCYTESUR  No results found for: LABMICR, WBCUA, RBCUA, LABEPIT, MUCUS, BACTERIA  Pertinent Imaging:  No results found for this or any previous visit.  No results found for this or any  previous visit.  No results found for this or any previous visit.  No results found for this or any previous visit.  No results found for this or any previous visit.  No results found for this or any previous visit.  No results found for this or any previous visit.  No results found for this or any previous visit.   Assessment & Plan:    1. Erectile dysfunction due to arterial insufficiency -COntinue sildenafil 20mg     No follow-ups on file.  , MD  [facility] [place]

## 2020-09-08 NOTE — Progress Notes (Signed)
Urological Symptom Review  Patient is experiencing the following symptoms: Erection problems (male only)  Pt has complaints   Review of Systems  Gastrointestinal (upper)  : Negative for upper GI symptoms  Gastrointestinal (lower) : Negative for lower GI symptoms  Constitutional : Negative for symptoms  Skin: Negative for skin symptoms  Eyes: Negative for eye symptoms  Ear/Nose/Throat : Negative for Ear/Nose/Throat symptoms  Hematologic/Lymphatic: Negative for Hematologic/Lymphatic symptoms  Cardiovascular : Negative for cardiovascular symptoms  Respiratory : Negative for respiratory symptoms  Endocrine: Negative for endocrine symptoms  Musculoskeletal: Negative for musculoskeletal symptoms  Neurological: Negative for neurological symptoms  Psychologic: Negative for psychiatric symptoms

## 2020-09-08 NOTE — Patient Instructions (Signed)
Erectile Dysfunction Erectile dysfunction (ED) is the inability to get or keep an erection in order to have sexual intercourse. ED is considered a symptom of an underlying disorder and not considered a disease. Erectile dysfunction may include:  Inability to get an erection.  Lack of enough hardness of the erection to allow penetration.  Loss of the erection before sex is finished. What are the causes? This condition may be caused by:  Certain medicines, such as: ? Pain relievers. ? Antihistamines. ? Antidepressants. ? Blood pressure medicines. ? Water pills (diuretics). ? Ulcer medicines. ? Muscle relaxants. ? Drugs.  Excessive drinking.  Psychological causes, such as: ? Anxiety. ? Depression. ? Sadness. ? Exhaustion. ? Performance fear. ? Stress.  Physical causes, such as: ? Artery problems. This may include diabetes, smoking, liver disease, or atherosclerosis. ? High blood pressure. ? Hormonal problems, such as low testosterone. ? Obesity. ? Nerve problems. This may include back or pelvic injuries, diabetes mellitus, multiple sclerosis, or Parkinson's disease. What are the signs or symptoms? Symptoms of this condition include:  Inability to get an erection.  Lack of enough hardness of the erection to allow penetration.  Loss of the erection before sex is finished.  Normal erections at some times, but with frequent unsatisfactory episodes.  Low sexual satisfaction in either partner due to erection problems.  A curved penis occurring with erection. The curve may cause pain or the penis may be too curved to allow for intercourse.  Never having nighttime erections. How is this diagnosed? This condition is often diagnosed by:  Performing a physical exam to find other diseases or specific problems with the penis.  Asking you detailed questions about the problem.  Performing blood tests to check for diabetes mellitus or to measure hormone levels.  Performing  other tests to check for underlying health conditions.  Performing an ultrasound exam to check for scarring.  Performing a test to check blood flow to the penis.  Doing a sleep study at home to measure nighttime erections. How is this treated? This condition may be treated by:  Medicine taken by mouth to help you achieve an erection (oral medicine).  Hormone replacement therapy to replace low testosterone levels.  Medicine that is injected into the penis. Your health care provider may instruct you how to give yourself these injections at home.  Vacuum pump. This is a pump with a ring on it. The pump and ring are placed on the penis and used to create pressure that helps the penis become erect.  Penile implant surgery. In this procedure, you may receive: ? An inflatable implant. This consists of cylinders, a pump, and a reservoir. The cylinders can be inflated with a fluid that helps to create an erection, and they can be deflated after intercourse. ? A semi-rigid implant. This consists of two silicone rubber rods. The rods provide some rigidity. They are also flexible, so the penis can both curve downward in its normal position and become straight for sexual intercourse.  Blood vessel surgery, to improve blood flow to the penis. During this procedure, a blood vessel from a different part of the body is placed into the penis to allow blood to flow around (bypass) damaged or blocked blood vessels.  Lifestyle changes, such as exercising more, losing weight, and quitting smoking. Follow these instructions at home: Medicines  Take over-the-counter and prescription medicines only as told by your health care provider. Do not increase the dosage without first discussing it with your health care   provider.  If you are using self-injections, perform injections as directed by your health care provider. Make sure to avoid any veins that are on the surface of the penis. After giving an injection,  apply pressure to the injection site for 5 minutes.   General instructions  Exercise regularly, as directed by your health care provider. Work with your health care provider to lose weight, if needed.  Do not use any products that contain nicotine or tobacco, such as cigarettes and e-cigarettes. If you need help quitting, ask your health care provider.  Before using a vacuum pump, read the instructions that come with the pump and discuss any questions with your health care provider.  Keep all follow-up visits as told by your health care provider. This is important. Contact a health care provider if:  You feel nauseous.  You vomit. Get help right away if:  You are taking oral or injectable medicines and you have an erection that lasts longer than 4 hours. If your health care provider is unavailable, go to the nearest emergency room for evaluation. An erection that lasts much longer than 4 hours can result in permanent damage to your penis.  You have severe pain in your groin or abdomen.  You develop redness or severe swelling of your penis.  You have redness spreading up into your groin or lower abdomen.  You are unable to urinate.  You experience chest pain or a rapid heart beat (palpitations) after taking oral medicines. Summary  Erectile dysfunction (ED) is the inability to get or keep an erection during sexual intercourse. This problem can usually be treated successfully.  This condition is diagnosed based on a physical exam, your symptoms, and tests to determine the cause. Treatment varies depending on the cause and may include medicines, hormone therapy, surgery, or a vacuum pump.  You may need follow-up visits to make sure that you are using your medicines or devices correctly.  Get help right away if you are taking or injecting medicines and you have an erection that lasts longer than 4 hours. This information is not intended to replace advice given to you by your health  care provider. Make sure you discuss any questions you have with your health care provider. Document Revised: 02/11/2020 Document Reviewed: 02/11/2020 Elsevier Patient Education  2021 [employer]  

## 2020-09-14 ENCOUNTER — Ambulatory Visit: Payer: BC Managed Care – PPO | Attending: Critical Care Medicine

## 2020-09-14 DIAGNOSIS — Z20822 Contact with and (suspected) exposure to covid-19: Secondary | ICD-10-CM

## 2020-09-15 LAB — SARS-COV-2, NAA 2 DAY TAT

## 2020-09-15 LAB — NOVEL CORONAVIRUS, NAA: SARS-CoV-2, NAA: NOT DETECTED

## 2020-09-21 ENCOUNTER — Ambulatory Visit: Payer: BC Managed Care – PPO | Attending: Internal Medicine

## 2020-09-21 DIAGNOSIS — Z20822 Contact with and (suspected) exposure to covid-19: Secondary | ICD-10-CM

## 2020-09-23 LAB — NOVEL CORONAVIRUS, NAA: SARS-CoV-2, NAA: NOT DETECTED

## 2020-09-23 LAB — SARS-COV-2, NAA 2 DAY TAT

## 2020-09-28 ENCOUNTER — Ambulatory Visit: Payer: BC Managed Care – PPO | Attending: Critical Care Medicine

## 2020-09-28 DIAGNOSIS — Z20822 Contact with and (suspected) exposure to covid-19: Secondary | ICD-10-CM

## 2020-09-29 LAB — SARS-COV-2, NAA 2 DAY TAT

## 2020-09-29 LAB — NOVEL CORONAVIRUS, NAA: SARS-CoV-2, NAA: NOT DETECTED

## 2020-10-05 ENCOUNTER — Ambulatory Visit: Payer: BC Managed Care – PPO | Attending: Internal Medicine

## 2020-10-05 DIAGNOSIS — Z20822 Contact with and (suspected) exposure to covid-19: Secondary | ICD-10-CM

## 2020-10-06 LAB — SARS-COV-2, NAA 2 DAY TAT

## 2020-10-06 LAB — NOVEL CORONAVIRUS, NAA: SARS-CoV-2, NAA: NOT DETECTED

## 2020-10-12 ENCOUNTER — Ambulatory Visit: Payer: BC Managed Care – PPO | Attending: Critical Care Medicine

## 2020-10-12 DIAGNOSIS — Z20822 Contact with and (suspected) exposure to covid-19: Secondary | ICD-10-CM

## 2020-10-13 LAB — SARS-COV-2, NAA 2 DAY TAT

## 2020-10-13 LAB — NOVEL CORONAVIRUS, NAA: SARS-CoV-2, NAA: NOT DETECTED

## 2021-09-10 ENCOUNTER — Ambulatory Visit: Payer: Self-pay | Admitting: Urology

## 2021-09-12 ENCOUNTER — Ambulatory Visit (INDEPENDENT_AMBULATORY_CARE_PROVIDER_SITE_OTHER): Payer: BC Managed Care – PPO | Admitting: Urology

## 2021-09-12 ENCOUNTER — Encounter: Payer: Self-pay | Admitting: Urology

## 2021-09-12 VITALS — BP 147/87 | HR 91 | Ht 71.0 in | Wt 250.0 lb

## 2021-09-12 DIAGNOSIS — N5201 Erectile dysfunction due to arterial insufficiency: Secondary | ICD-10-CM | POA: Diagnosis not present

## 2021-09-12 LAB — URINALYSIS, ROUTINE W REFLEX MICROSCOPIC
Bilirubin, UA: NEGATIVE
Ketones, UA: NEGATIVE
Leukocytes,UA: NEGATIVE
Nitrite, UA: NEGATIVE
Protein,UA: NEGATIVE
RBC, UA: NEGATIVE
Specific Gravity, UA: 1.015 (ref 1.005–1.030)
Urobilinogen, Ur: 0.2 mg/dL (ref 0.2–1.0)
pH, UA: 5.5 (ref 5.0–7.5)

## 2021-09-12 MED ORDER — SILDENAFIL CITRATE 20 MG PO TABS: 20.0000 mg | ORAL_TABLET | ORAL | 3 refills | Status: DC | PRN

## 2021-09-12 NOTE — Addendum Note (Signed)
Addended by: Malen Gauze on: 09/12/2021 09:47 AM ? ? Modules accepted: Orders ? ?

## 2021-09-12 NOTE — Patient Instructions (Signed)
Erectile Dysfunction °Erectile dysfunction (ED) is the inability to get or keep an erection in order to have sexual intercourse. ED is considered a symptom of an underlying disorder and is not considered a disease. ED may include: °Inability to get an erection. °Lack of enough hardness of the erection to allow penetration. °Loss of erection before sex is finished. °What are the causes? °This condition may be caused by: °Physical causes, such as: °Artery problems. This may include heart disease, high blood pressure, atherosclerosis, and diabetes. °Hormonal problems, such as low testosterone. °Obesity. °Nerve problems. This may include back or pelvic injuries, multiple sclerosis, Parkinson's disease, spinal cord injury, and stroke. °Certain medicines, such as: °Pain relievers. °Antidepressants. °Blood pressure medicines and water pills (diuretics). °Cancer medicines. °Antihistamines. °Muscle relaxants. °Lifestyle factors, such as: °Use of drugs such as marijuana, cocaine, or opioids. °Excessive use of alcohol. °Smoking. °Lack of physical activity or exercise. °Psychological causes, such as: °Anxiety or stress. °Sadness or depression. °Exhaustion. °Fear about sexual performance. °Guilt. °What are the signs or symptoms? °Symptoms of this condition include: °Inability to get an erection. °Lack of enough hardness of the erection to allow penetration. °Loss of the erection before sex is finished. °Sometimes having normal erections, but with frequent unsatisfactory episodes. °Low sexual satisfaction in either partner due to erection problems. °A curved penis occurring with erection. The curve may cause pain, or the penis may be too curved to allow for intercourse. °Never having nighttime or morning erections. °How is this diagnosed? °This condition is often diagnosed by: °Performing a physical exam to find other diseases or specific problems with the penis. °Asking you detailed questions about the problem. °Doing tests,  such as: °Blood tests to check for diabetes mellitus or high cholesterol, or to measure hormone levels. °Other tests to check for underlying health conditions. °An ultrasound exam to check for scarring. °A test to check blood flow to the penis. °Doing a sleep study at home to measure nighttime erections. °How is this treated? °This condition may be treated by: °Medicines, such as: °Medicine taken by mouth to help you achieve an erection (oral medicine). °Hormone replacement therapy to replace low testosterone levels. °Medicine that is injected into the penis. Your health care provider may instruct you how to give yourself these injections at home. °Medicine that is delivered with a short applicator tube. The tube is inserted into the opening at the tip of the penis, which is the opening of the urethra. A tiny pellet of medicine is put in the urethra. The pellet dissolves and enhances erectile function. This is also called MUSE (medicated urethral system for erections) therapy. °Vacuum pump. This is a pump with a ring on it. The pump and ring are placed on the penis and used to create pressure that helps the penis become erect. °Penile implant surgery. In this procedure, you may receive: °An inflatable implant. This consists of cylinders, a pump, and a reservoir. The cylinders can be inflated with a fluid that helps to create an erection, and they can be deflated after intercourse. °A semi-rigid implant. This consists of two silicone rubber rods. The rods provide some rigidity. They are also flexible, so the penis can both curve downward in its normal position and become straight for sexual intercourse. °Blood vessel surgery to improve blood flow to the penis. During this procedure, a blood vessel from a different part of the body is placed into the penis to allow blood to flow around (bypass) damaged or blocked blood vessels. °Lifestyle changes,   such as exercising more, losing weight, and quitting smoking. °Follow  these instructions at home: °Medicines ° °Take over-the-counter and prescription medicines only as told by your health care provider. Do not increase the dosage without first discussing it with your health care provider. °If you are using self-injections, do injections as directed by your health care provider. Make sure you avoid any veins that are on the surface of the penis. After giving an injection, apply pressure to the injection site for 5 minutes. °Talk to your health care provider about how to prevent headaches while taking ED medicines. These medicines may cause a sudden headache due to the increase in blood flow in your body. °General instructions °Exercise regularly, as directed by your health care provider. Work with your health care provider to lose weight, if needed. °Do not use any products that contain nicotine or tobacco. These products include cigarettes, chewing tobacco, and vaping devices, such as e-cigarettes. If you need help quitting, ask your health care provider. °Before using a vacuum pump, read the instructions that come with the pump and discuss any questions with your health care provider. °Keep all follow-up visits. This is important. °Contact a health care provider if: °You feel nauseous. °You are vomiting. °You get sudden headaches while taking ED medicines. °You have any concerns about your sexual health. °Get help right away if: °You are taking oral or injectable medicines and you have an erection that lasts longer than 4 hours. If your health care provider is unavailable, go to the nearest emergency room for evaluation. An erection that lasts much longer than 4 hours can result in permanent damage to your penis. °You have severe pain in your groin or abdomen. °You develop redness or severe swelling of your penis. °You have redness spreading at your groin or lower abdomen. °You are unable to urinate. °You experience chest pain or a rapid heartbeat (palpitations) after taking oral  medicines. °These symptoms may represent a serious problem that is an emergency. Do not wait to see if the symptoms will go away. Get medical help right away. Call your local emergency services (911 in the [place] Do not drive yourself to the hospital. °Summary °Erectile dysfunction (ED) is the inability to get or keep an erection during sexual intercourse. °This condition is diagnosed based on a physical exam, your symptoms, and tests to determine the cause. Treatment varies depending on the cause and may include medicines, hormone therapy, surgery, or a vacuum pump. °You may need follow-up visits to make sure that you are using your medicines or devices correctly. °Get help right away if you are taking or injecting medicines and you have an erection that lasts longer than 4 hours. °This information is not intended to replace advice given to you by your health care provider. Make sure you discuss any questions you have with your health care provider. °Document Revised: 08/23/2020 Document Reviewed: 08/23/2020 °Elsevier Patient Education © 2022 [employer] Inc[employer] ° °

## 2021-09-12 NOTE — Progress Notes (Signed)
? ?09/12/2021 ?9:37 AM  ? ?Luis York ?Sep 30, 1970 ?[id number] ? ?Referring provider: Ignatius Specking, MD ?[address] ?[place],  [place] [address] ? ?Followup erectile dysfunction ? ? ?HPI: ?Luis York is a 51yo here for followup for erectile dysfunction. IPSS 5 QOL 1. No new medical problems since last visit. He uses sildenafil 40-60mg  prn with fair results. He has better results in the morning. He is interested in shock wave therapy ED. No other complaints today.  ? ? ?PMH: ?Past Medical History:  ?Diagnosis Date  ? Diabetes mellitus without complication (HCC)   ? Erectile dysfunction   ? Heartburn   ? Sleep apnea   ? ? ?Surgical History: ?No past surgical history on file. ? ?Home Medications:  ?Allergies as of 09/12/2021   ?No Known Allergies ?  ? ?  ?Medication List  ?  ? ?  ? Accurate as of September 12, 2021  9:37 AM. If you have any questions, ask your nurse or doctor.  ?  ?  ? ?  ? ?glipiZIDE-metformin 5-500 MG tablet ?Commonly known as: METAGLIP ?Take 0.5 tablets by mouth. ?  ?omeprazole 20 MG capsule ?Commonly known as: PRILOSEC ?Take 20 mg by mouth daily. ?  ?OneTouch Verio test strip ?Generic drug: glucose blood ?1 each 2 (two) times daily. ?  ?rosuvastatin 10 MG tablet ?Commonly known as: CRESTOR ?Take 10 mg by mouth daily. ?  ?sildenafil 20 MG tablet ?Commonly known as: REVATIO ?Take 1 tablet (20 mg total) by mouth as needed. ?  ? ?  ? ? ?Allergies: No Known Allergies ? ?Family History: ?Family History  ?Problem Relation Age of Onset  ? Heart disease Mother   ? Diabetes Mother   ? Heart attack Father   ? High blood pressure Father   ? Diabetes Father   ? Stroke Paternal Grandmother   ? Heart disease Paternal Grandmother   ? ? ?Social History:  reports that he quit smoking about 25 years ago. He has never used smokeless tobacco. He reports that he does not drink alcohol. No history on file for drug use. ? ?ROS: ?All other review of systems were reviewed and are negative except what is noted above in HPI ? ?Physical Exam: ?BP  (!) 147/87   Pulse 91   Ht 5\' 11"  (1.803 m)   Wt 250 lb (113.4 kg)   BMI 34.87 kg/m?   ?Constitutional:  Alert and oriented, No acute distress. ?HEENT: [place] AT, moist mucus membranes.  Trachea midline, no masses. ?Cardiovascular: No clubbing, cyanosis, or edema. ?Respiratory: Normal respiratory effort, no increased work of breathing. ?GI: Abdomen is soft, nontender, nondistended, no abdominal masses ?GU: No CVA tenderness.  ?Lymph: No cervical or inguinal lymphadenopathy. ?Skin: No rashes, bruises or suspicious lesions. ?Neurologic: Grossly intact, no focal deficits, moving all 4 extremities. ?Psychiatric: Normal mood and affect. ? ?Laboratory Data: ?No results found for: WBC, HGB, HCT, MCV, PLT ? ?No results found for: CREATININE ? ?No results found for: PSA ? ?No results found for: TESTOSTERONE ? ?No results found for: HGBA1C ? ?Urinalysis ?   ?Component Value Date/Time  ? APPEARANCEUR Clear 09/08/2020 0957  ? GLUCOSEU 3+ (A) 09/08/2020 0957  ? BILIRUBINUR Negative 09/08/2020 0957  ? PROTEINUR Negative 09/08/2020 0957  ? NITRITE Negative 09/08/2020 0957  ? LEUKOCYTESUR Negative 09/08/2020 0957  ? ? ?No results found for: LABMICR, WBCUA, RBCUA, LABEPIT, MUCUS, BACTERIA ? ?Pertinent Imaging: ? ?No results found for this or any previous visit. ? ?No results found for this or  any previous visit. ? ?No results found for this or any previous visit. ? ?No results found for this or any previous visit. ? ?No results found for this or any previous visit. ? ?No results found for this or any previous visit. ? ?No results found for this or any previous visit. ? ?No results found for this or any previous visit. ? ? ?Assessment & Plan:   ? ?1. Erectile dysfunction due to arterial insufficiency ?-Continue sildenafil 40-60mg  prn ?-patient to call [facility] for consideration of shock wave therapy for erectile dysfunction ?- Urinalysis, Routine w reflex microscopic ? ? ?No follow-ups on file. ? ?Wilkie Aye, MD ? ?[facility]  [facility] [place] ?  ?

## 2022-05-16 ENCOUNTER — Other Ambulatory Visit (HOSPITAL_COMMUNITY): Payer: Self-pay | Admitting: Internal Medicine

## 2022-05-16 DIAGNOSIS — D696 Thrombocytopenia, unspecified: Secondary | ICD-10-CM

## 2022-05-16 DIAGNOSIS — R7989 Other specified abnormal findings of blood chemistry: Secondary | ICD-10-CM

## 2022-05-30 ENCOUNTER — Ambulatory Visit (HOSPITAL_COMMUNITY)
Admission: RE | Admit: 2022-05-30 | Discharge: 2022-05-30 | Disposition: A | Discharge status: Home / Self Care | Payer: BC Managed Care – PPO | Source: Ambulatory Visit | Attending: Internal Medicine | Admitting: Internal Medicine

## 2022-05-30 DIAGNOSIS — D696 Thrombocytopenia, unspecified: Secondary | ICD-10-CM | POA: Diagnosis present

## 2022-05-30 DIAGNOSIS — R7989 Other specified abnormal findings of blood chemistry: Secondary | ICD-10-CM | POA: Insufficient documentation

## 2022-09-13 ENCOUNTER — Ambulatory Visit: Payer: BC Managed Care – PPO | Admitting: Urology

## 2022-09-20 ENCOUNTER — Other Ambulatory Visit: Payer: Self-pay | Admitting: Urology

## 2022-09-26 ENCOUNTER — Other Ambulatory Visit: Payer: Self-pay | Admitting: Urology

## 2022-10-09 ENCOUNTER — Telehealth: Payer: Self-pay

## 2022-10-09 ENCOUNTER — Ambulatory Visit: Payer: BC Managed Care – PPO | Admitting: Urology

## 2022-10-09 ENCOUNTER — Encounter: Payer: Self-pay | Admitting: Urology

## 2022-10-09 VITALS — BP 135/85 | HR 83

## 2022-10-09 DIAGNOSIS — N5201 Erectile dysfunction due to arterial insufficiency: Secondary | ICD-10-CM | POA: Diagnosis not present

## 2022-10-09 MED ORDER — SILDENAFIL CITRATE 20 MG PO TABS: 20.0000 mg | ORAL_TABLET | ORAL | 11 refills | Status: DC | PRN

## 2022-10-09 MED ORDER — VALSARTAN 80 MG PO TABS: 80.0000 mg | ORAL_TABLET | Freq: Every day | ORAL | 11 refills | Status: AC

## 2022-10-09 NOTE — Patient Instructions (Signed)
Erectile Dysfunction ?Erectile dysfunction (ED) is the inability to get or keep an erection in order to have sexual intercourse. ED is considered a symptom of an underlying disorder and is not considered a disease. ED may include: ?Inability to get an erection. ?Lack of enough hardness of the erection to allow penetration. ?Loss of erection before sex is finished. ?What are the causes? ?This condition may be caused by: ?Physical causes, such as: ?Artery problems. This may include heart disease, high blood pressure, atherosclerosis, and diabetes. ?Hormonal problems, such as low testosterone. ?Obesity. ?Nerve problems. This may include back or pelvic injuries, multiple sclerosis, Parkinson's disease, spinal cord injury, and stroke. ?Certain medicines, such as: ?Pain relievers. ?Antidepressants. ?Blood pressure medicines and water pills (diuretics). ?Cancer medicines. ?Antihistamines. ?Muscle relaxants. ?Lifestyle factors, such as: ?Use of drugs such as marijuana, cocaine, or opioids. ?Excessive use of alcohol. ?Smoking. ?Lack of physical activity or exercise. ?Psychological causes, such as: ?Anxiety or stress. ?Sadness or depression. ?Exhaustion. ?Fear about sexual performance. ?Guilt. ?What are the signs or symptoms? ?Symptoms of this condition include: ?Inability to get an erection. ?Lack of enough hardness of the erection to allow penetration. ?Loss of the erection before sex is finished. ?Sometimes having normal erections, but with frequent unsatisfactory episodes. ?Low sexual satisfaction in either partner due to erection problems. ?A curved penis occurring with erection. The curve may cause pain, or the penis may be too curved to allow for intercourse. ?Never having nighttime or morning erections. ?How is this diagnosed? ?This condition is often diagnosed by: ?Performing a physical exam to find other diseases or specific problems with the penis. ?Asking you detailed questions about the problem. ?Doing tests,  such as: ?Blood tests to check for diabetes mellitus or high cholesterol, or to measure hormone levels. ?Other tests to check for underlying health conditions. ?An ultrasound exam to check for scarring. ?A test to check blood flow to the penis. ?Doing a sleep study at home to measure nighttime erections. ?How is this treated? ?This condition may be treated by: ?Medicines, such as: ?Medicine taken by mouth to help you achieve an erection (oral medicine). ?Hormone replacement therapy to replace low testosterone levels. ?Medicine that is injected into the penis. Your health care provider may instruct you how to give yourself these injections at home. ?Medicine that is delivered with a short applicator tube. The tube is inserted into the opening at the tip of the penis, which is the opening of the urethra. A tiny pellet of medicine is put in the urethra. The pellet dissolves and enhances erectile function. This is also called MUSE (medicated urethral system for erections) therapy. ?Vacuum pump. This is a pump with a ring on it. The pump and ring are placed on the penis and used to create pressure that helps the penis become erect. ?Penile implant surgery. In this procedure, you may receive: ?An inflatable implant. This consists of cylinders, a pump, and a reservoir. The cylinders can be inflated with a fluid that helps to create an erection, and they can be deflated after intercourse. ?A semi-rigid implant. This consists of two silicone rubber rods. The rods provide some rigidity. They are also flexible, so the penis can both curve downward in its normal position and become straight for sexual intercourse. ?Blood vessel surgery to improve blood flow to the penis. During this procedure, a blood vessel from a different part of the body is placed into the penis to allow blood to flow around (bypass) damaged or blocked blood vessels. ?Lifestyle changes,   such as exercising more, losing weight, and quitting smoking. ?Follow  these instructions at home: ?Medicines ? ?Take over-the-counter and prescription medicines only as told by your health care provider. Do not increase the dosage without first discussing it with your health care provider. ?If you are using self-injections, do injections as directed by your health care provider. Make sure you avoid any veins that are on the surface of the penis. After giving an injection, apply pressure to the injection site for 5 minutes. ?Talk to your health care provider about how to prevent headaches while taking ED medicines. These medicines may cause a sudden headache due to the increase in blood flow in your body. ?General instructions ?Exercise regularly, as directed by your health care provider. Work with your health care provider to lose weight, if needed. ?Do not use any products that contain nicotine or tobacco. These products include cigarettes, chewing tobacco, and vaping devices, such as e-cigarettes. If you need help quitting, ask your health care provider. ?Before using a vacuum pump, read the instructions that come with the pump and discuss any questions with your health care provider. ?Keep all follow-up visits. This is important. ?Contact a health care provider if: ?You feel nauseous. ?You are vomiting. ?You get sudden headaches while taking ED medicines. ?You have any concerns about your sexual health. ?Get help right away if: ?You are taking oral or injectable medicines and you have an erection that lasts longer than 4 hours. If your health care provider is unavailable, go to the nearest emergency room for evaluation. An erection that lasts much longer than 4 hours can result in permanent damage to your penis. ?You have severe pain in your groin or abdomen. ?You develop redness or severe swelling of your penis. ?You have redness spreading at your groin or lower abdomen. ?You are unable to urinate. ?You experience chest pain or a rapid heartbeat (palpitations) after taking oral  medicines. ?These symptoms may represent a serious problem that is an emergency. Do not wait to see if the symptoms will go away. Get medical help right away. Call your local emergency services (911 in the [place] Do not drive yourself to the hospital. ?Summary ?Erectile dysfunction (ED) is the inability to get or keep an erection during sexual intercourse. ?This condition is diagnosed based on a physical exam, your symptoms, and tests to determine the cause. Treatment varies depending on the cause and may include medicines, hormone therapy, surgery, or a vacuum pump. ?You may need follow-up visits to make sure that you are using your medicines or devices correctly. ?Get help right away if you are taking or injecting medicines and you have an erection that lasts longer than 4 hours. ?This information is not intended to replace advice given to you by your health care provider. Make sure you discuss any questions you have with your health care provider. ?Document Revised: 08/23/2020 Document Reviewed: 08/23/2020 ?Elsevier Patient Education ? 2023 Elsevier Inc[employer] ? ?

## 2022-10-09 NOTE — Telephone Encounter (Signed)
Patient called requesting for the sildenafil (REVATIO) 20 MG tablet prescription to be resent to [facility] pharmacy, the Pharmacy  advised it did not have any refills.     Thank you

## 2022-10-09 NOTE — Progress Notes (Signed)
10/09/2022 10:02 AM   Luis York [birth year] [id number]  Referring provider: Ignatius Specking, MD [address] [place],  [place] [address]  Followup erectile dysfunction   HPI: Luis York is a 51yo here for followup for erectile dysfunction. IPSS 4 QOL 0. He uses 60-80mg  of sildenafil prn with good results. No other complaints today   PMH: Past Medical History:  Diagnosis Date   Diabetes mellitus without complication (HCC)    Erectile dysfunction    Heartburn    Sleep apnea     Surgical History: No past surgical history on file.  Home Medications:  Allergies as of 10/09/2022   No Known Allergies      Medication List        Accurate as of Oct 09, 2022 10:02 AM. If you have any questions, ask your nurse or doctor.          glipiZIDE-metformin 5-500 MG tablet Commonly known as: METAGLIP Take 0.5 tablets by mouth.   omeprazole 20 MG capsule Commonly known as: PRILOSEC Take 20 mg by mouth daily.   OneTouch Verio test strip Generic drug: glucose blood 1 each 2 (two) times daily.   rosuvastatin 10 MG tablet Commonly known as: CRESTOR Take 10 mg by mouth daily.   sildenafil 20 MG tablet Commonly known as: REVATIO TAKE 1 TABLET BY MOUTH AS NEEDED        Allergies: No Known Allergies  Family History: Family History  Problem Relation Age of Onset   Heart disease Mother    Diabetes Mother    Heart attack Father    High blood pressure Father    Diabetes Father    Stroke Paternal Grandmother    Heart disease Paternal Grandmother     Social History:  reports that he quit smoking about 26 years ago. He has never used smokeless tobacco. He reports that he does not drink alcohol. No history on file for drug use.  ROS: All other review of systems were reviewed and are negative except what is noted above in HPI  Physical Exam: BP 135/85   Pulse 83   Constitutional:  Alert and oriented, No acute distress. HEENT: [place] AT, moist mucus membranes.  Trachea midline, no  masses. Cardiovascular: No clubbing, cyanosis, or edema. Respiratory: Normal respiratory effort, no increased work of breathing. GI: Abdomen is soft, nontender, nondistended, no abdominal masses GU: No CVA tenderness.  Lymph: No cervical or inguinal lymphadenopathy. Skin: No rashes, bruises or suspicious lesions. Neurologic: Grossly intact, no focal deficits, moving all 4 extremities. Psychiatric: Normal mood and affect.  Laboratory Data: No results found for: "WBC", "HGB", "HCT", "MCV", "PLT"  No results found for: "CREATININE"  No results found for: "PSA"  No results found for: "TESTOSTERONE"  No results found for: "HGBA1C"  Urinalysis    Component Value Date/Time   APPEARANCEUR Clear 09/12/2021 0917   GLUCOSEU 3+ (A) 09/12/2021 0917   BILIRUBINUR Negative 09/12/2021 0917   PROTEINUR Negative 09/12/2021 0917   NITRITE Negative 09/12/2021 0917   LEUKOCYTESUR Negative 09/12/2021 0917    Lab Results  Component Value Date   LABMICR Comment 09/12/2021    Pertinent Imaging:  No results found for this or any previous visit.  No results found for this or any previous visit.  No results found for this or any previous visit.  No results found for this or any previous visit.  No results found for this or any previous visit.  No valid procedures specified. No results found for this  or any previous visit.  No results found for this or any previous visit.   Assessment & Plan:    1. Erectile dysfunction due to arterial insufficiency Continue sildenafil 60-80mg  PRN   No follow-ups on file.  Wilkie Aye, MD  [facility] [place]

## 2023-07-23 ENCOUNTER — Encounter: Payer: Self-pay | Admitting: Gastroenterology

## 2023-08-08 ENCOUNTER — Ambulatory Visit: Payer: 59

## 2023-08-08 VITALS — Ht 71.0 in | Wt 245.0 lb

## 2023-08-08 DIAGNOSIS — Z1211 Encounter for screening for malignant neoplasm of colon: Secondary | ICD-10-CM

## 2023-08-08 MED ORDER — SUFLAVE 178.7 G PO SOLR: 1.0000 | Freq: Once | ORAL | 0 refills | Status: AC

## 2023-08-08 NOTE — Progress Notes (Signed)
 No egg or soy allergy known to patient  No issues known to pt with past sedation with any surgeries or procedures Patient denies ever being told they had issues or difficulty with intubation  No FH of Malignant Hyperthermia Pt is not on diet pills Pt is not on  home 02  Pt is not on blood thinners  Pt denies issues with constipation  No A fib or A flutter Have any cardiac testing pending--no Ambulates independently

## 2023-09-01 ENCOUNTER — Encounter: Payer: Self-pay | Admitting: Gastroenterology

## 2023-09-05 ENCOUNTER — Ambulatory Visit: Payer: Self-pay | Admitting: Gastroenterology

## 2023-09-05 ENCOUNTER — Encounter: Payer: Self-pay | Admitting: Gastroenterology

## 2023-09-05 VITALS — BP 133/84 | HR 71 | Temp 98.3°F | Resp 12 | Ht 71.0 in | Wt 245.0 lb

## 2023-09-05 DIAGNOSIS — D120 Benign neoplasm of cecum: Secondary | ICD-10-CM

## 2023-09-05 DIAGNOSIS — K5730 Diverticulosis of large intestine without perforation or abscess without bleeding: Secondary | ICD-10-CM

## 2023-09-05 DIAGNOSIS — Z1211 Encounter for screening for malignant neoplasm of colon: Secondary | ICD-10-CM | POA: Diagnosis present

## 2023-09-05 DIAGNOSIS — K635 Polyp of colon: Secondary | ICD-10-CM | POA: Diagnosis not present

## 2023-09-05 MED ORDER — SODIUM CHLORIDE 0.9 % IV SOLN: 500.0000 mL | Freq: Once | INTRAVENOUS | Status: AC

## 2023-09-05 NOTE — Patient Instructions (Signed)
 YOU HAD AN ENDOSCOPIC PROCEDURE TODAY AT THE [facility]:   Refer to the procedure report that was given to you for any specific questions about what was found during the examination.  If the procedure report does not answer your questions, please call your gastroenterologist to clarify.  If you requested that your care partner not be given the details of your procedure findings, then the procedure report has been included in a sealed envelope for you to review at your convenience later.  YOU SHOULD EXPECT: Some feelings of bloating in the abdomen. Passage of more gas than usual.  Walking can help get rid of the air that was put into your GI tract during the procedure and reduce the bloating. If you had a lower endoscopy (such as a colonoscopy or flexible sigmoidoscopy) you may notice spotting of blood in your stool or on the toilet paper. If you underwent a bowel prep for your procedure, you may not have a normal bowel movement for a few days.  Please Note:  You might notice some irritation and congestion in your nose or some drainage.  This is from the oxygen used during your procedure.  There is no need for concern and it should clear up in a day or so.  SYMPTOMS TO REPORT IMMEDIATELY:  Following lower endoscopy (colonoscopy or flexible sigmoidoscopy):  Excessive amounts of blood in the stool  Significant tenderness or worsening of abdominal pains  Swelling of the abdomen that is new, acute  Fever of 100F or higher   For urgent or emergent issues, a gastroenterologist can be reached at any hour by calling [phone]. Do not use MyChart messaging for urgent concerns.    DIET:  We do recommend a small meal at first, but then you may proceed to your regular diet.  Drink plenty of fluids but you should avoid alcoholic beverages for 24 hours.  MEDICATIONS: Continue present medications.  FOLLOW UP: Await pathology results. Repeat colonoscopy (date not yet determined) for  surveillance based on pathology results.  Please see handouts given to you by your recovery nurse: Polyps, Diverticulosis.  Thank you for allowing [place] to provide for your healthcare needs today.  ACTIVITY:  You should plan to take it easy for the rest of today and you should NOT DRIVE or use heavy machinery until tomorrow (because of the sedation medicines used during the test).    FOLLOW UP: Our staff will call the number listed on your records the next business day following your procedure.  We will call around 7:15- 8:00 am to check on you and address any questions or concerns that you may have regarding the information given to you following your procedure. If we do not reach you, we will leave a message.     If any biopsies were taken you will be contacted by phone or by letter within the next 1-3 weeks.  Please call us at [phone] if you have not heard about the biopsies in 3 weeks.    SIGNATURES/CONFIDENTIALITY: You and/or your care partner have signed paperwork which will be entered into your electronic medical record.  These signatures attest to the fact that that the information above on your After Visit Summary has been reviewed and is understood.  Full responsibility of the confidentiality of this discharge information lies with you and/or your care-partner.

## 2023-09-05 NOTE — Progress Notes (Signed)
 Called to room to assist during endoscopic procedure.  Patient ID and intended procedure confirmed with present staff. Received instructions for my participation in the procedure from the performing physician.

## 2023-09-05 NOTE — Progress Notes (Signed)
 A/o x 3, VSS, gd SR's, pleased with anesthesia, report to RN

## 2023-09-05 NOTE — Progress Notes (Signed)
 Pt's states no medical or surgical changes since previsit or office visit.

## 2023-09-05 NOTE — Op Note (Signed)
 [facility] Patient Name: Luis York Procedure Date: 09/05/2023 8:25 AM MRN: [id number] Endoscopist: Lorin Picket E. Tomasa Rand , MD, [id number] Age: 53 Referring MD:  Date of Birth: [birth year] Gender: Male Account #: [id number] Procedure:                Colonoscopy Indications:              Screening for colorectal malignant neoplasm, This                            is the patient's first colonoscopy Medicines:                Monitored Anesthesia Care Procedure:                Pre-Anesthesia Assessment:                           - Prior to the procedure, a History and Physical                            was performed, and patient medications and                            allergies were reviewed. The patient's tolerance of                            previous anesthesia was also reviewed. The risks                            and benefits of the procedure and the sedation                            options and risks were discussed with the patient.                            All questions were answered, and informed consent                            was obtained. Prior Anticoagulants: The patient has                            taken no anticoagulant or antiplatelet agents. ASA                            Grade Assessment: II - A patient with mild systemic                            disease. After reviewing the risks and benefits,                            the patient was deemed in satisfactory condition to                            undergo the procedure.  After obtaining informed consent, the colonoscope                            was passed under direct vision. Throughout the                            procedure, the patient's blood pressure, pulse, and                            oxygen saturations were monitored continuously. The                            Olympus CF-HQ190L ([id number]) Colonoscope was                            introduced through the anus  and advanced to the the                            cecum, identified by appendiceal orifice and                            ileocecal valve. The colonoscopy was performed                            without difficulty. The patient tolerated the                            procedure well. The quality of the bowel                            preparation was adequate. The ileocecal valve,                            appendiceal orifice, and rectum were photographed.                            The bowel preparation used was SUFLAVE via split                            dose instruction. Scope In: 8:35:13 AM Scope Out: 8:55:46 AM Scope Withdrawal Time: 0 hours 13 minutes 57 seconds  Total Procedure Duration: 0 hours 20 minutes 33 seconds  Findings:                 The perianal and digital rectal examinations were                            normal. Pertinent negatives include normal                            sphincter tone and no palpable rectal lesions.                           A 7 mm polyp was found in the cecum. The polyp was  sessile. The polyp was removed with a cold snare.                            Resection and retrieval were complete. Estimated                            blood loss was minimal.                           A single small-mouthed diverticulum was found in                            the sigmoid colon.                           The exam was otherwise normal throughout the                            examined colon.                           The retroflexed view of the distal rectum and anal                            verge was normal and showed no anal or rectal                            abnormalities. Complications:            No immediate complications. Estimated Blood Loss:     Estimated blood loss was minimal. Impression:               - One 7 mm polyp in the cecum, removed with a cold                            snare. Resected and retrieved.                            - Diverticulosis in the sigmoid colon.                           - The distal rectum and anal verge are normal on                            retroflexion view. Recommendation:           - Patient has a contact number available for                            emergencies. The signs and symptoms of potential                            delayed complications were discussed with the                            patient. Return to normal activities tomorrow.  Written discharge instructions were provided to the                            patient.                           - Resume previous diet.                           - Continue present medications.                           - Await pathology results.                           - Repeat colonoscopy (date not yet determined) for                            surveillance based on pathology results. Manali Mcelmurry E. Tomasa Rand, MD 09/05/2023 8:59:59 AM This report has been signed electronically.

## 2023-09-05 NOTE — Progress Notes (Signed)
 [facility] History and Physical   Primary Care Physician:  Ignatius Specking, MD   Reason for Procedure:   Colon cancer screening  Plan:    Screening colonoscopy     HPI: Luis York is a 53 y.o. male undergoing initial average risk screening colonoscopy.  He has no family history of colon cancer and no chronic GI symptoms.    Past Medical History:  Diagnosis Date   Diabetes mellitus without complication (HCC)    Erectile dysfunction    Heartburn    Sleep apnea     History reviewed. No pertinent surgical history.  Prior to Admission medications   Medication Sig Start Date End Date Taking? Authorizing Provider  glipiZIDE-metformin (METAGLIP) 5-500 MG tablet Take 0.5 tablets by mouth. 2 in am; 1 in evening 07/19/19  Yes [provider]  omeprazole (PRILOSEC) 20 MG capsule Take 20 mg by mouth daily. 07/28/19  Yes [provider]  ONETOUCH VERIO test strip 1 each 2 (two) times daily. 07/19/19  Yes [provider]  rosuvastatin (CRESTOR) 10 MG tablet Take 10 mg by mouth daily. 07/19/19  Yes [provider]  valsartan (DIOVAN) 80 MG tablet Take 1 tablet (80 mg total) by mouth daily. 10/09/22  Yes McKenzie, Mardene Celeste, MD  sildenafil (REVATIO) 20 MG tablet Take 1-3 tablets (20-60 mg total) by mouth as needed. 10/09/22   Malen Gauze, MD    Current Outpatient Medications  Medication Sig Dispense Refill   glipiZIDE-metformin (METAGLIP) 5-500 MG tablet Take 0.5 tablets by mouth. 2 in am; 1 in evening     omeprazole (PRILOSEC) 20 MG capsule Take 20 mg by mouth daily.     ONETOUCH VERIO test strip 1 each 2 (two) times daily.     rosuvastatin (CRESTOR) 10 MG tablet Take 10 mg by mouth daily.     valsartan (DIOVAN) 80 MG tablet Take 1 tablet (80 mg total) by mouth daily. 30 tablet 11   sildenafil (REVATIO) 20 MG tablet Take 1-3 tablets (20-60 mg total) by mouth as needed. 90 tablet 11   Current Facility-Administered Medications  Medication Dose  Route Frequency Provider Last Rate Last Admin   0.9 %  sodium chloride infusion  500 mL Intravenous Once Jenel Lucks, MD        Allergies as of 09/05/2023   (No Known Allergies)    Family History  Problem Relation Age of Onset   Heart disease Mother    Diabetes Mother    Heart attack Father    High blood pressure Father    Diabetes Father    Cancer Father    Stroke Paternal Grandmother    Heart disease Paternal Grandmother    Colon cancer Neg Hx    Esophageal cancer Neg Hx    Rectal cancer Neg Hx    Stomach cancer Neg Hx     Social History   Socioeconomic History   Marital status: Married    Spouse name: Not on file   Number of children: Not on file   Years of education: Not on file   Highest education level: Not on file  Occupational History   Not on file  Tobacco Use   Smoking status: Former    Current packs/day: 0.00    Types: Cigarettes    Quit date: 08/08/1996    Years since quitting: 27.0   Smokeless tobacco: Never  Vaping Use   Vaping status: Never Used  Substance and Sexual Activity   Alcohol use: No  Drug use: Never   Sexual activity: Not on file  Other Topics Concern   Not on file  Social History Narrative   Not on file   Social Drivers of Health   Financial Resource Strain: Not on file  Food Insecurity: Not on file  Transportation Needs: Not on file  Physical Activity: Not on file  Stress: Not on file  Social Connections: Not on file  Intimate Partner Violence: Not on file    Review of Systems:  All other review of systems negative except as mentioned in the HPI.  Physical Exam: Vital signs BP (!) 160/96   Pulse 65   Temp 98.3 F (36.8 C) (Skin)   Ht 5\' 11"  (1.803 m)   Wt 245 lb (111.1 kg)   SpO2 97%   BMI 34.17 kg/m   General:   Alert,  Well-developed, well-nourished, pleasant and cooperative in NAD Airway:  Mallampati 2 Lungs:  Clear throughout to auscultation.   Heart:  Regular rate and rhythm; no murmurs, clicks,  rubs,  or gallops. Abdomen:  Soft, nontender and nondistended. Normal bowel sounds.   Neuro/Psych:  Normal mood and affect. A and O x 3   Kodi Steil E. Tomasa Rand, MD [facility]

## 2023-09-08 ENCOUNTER — Telehealth: Payer: Self-pay

## 2023-09-08 NOTE — Telephone Encounter (Signed)
  Follow up Call-     09/05/2023    7:40 AM  Call back number  Post procedure Call Back phone  # [phone]  Permission to leave phone message Yes     Patient questions:  Do you have a fever, pain , or abdominal swelling? No. Pain Score  0 *  Have you tolerated food without any problems? Yes.    Have you been able to return to your normal activities? Yes.    Do you have any questions about your discharge instructions: Diet   No. Medications  No. Follow up visit  No.  Do you have questions or concerns about your Care? No.  Actions: * If pain score is 4 or above: No action needed, pain <4.

## 2023-09-09 LAB — SURGICAL PATHOLOGY

## 2023-09-15 ENCOUNTER — Encounter: Payer: Self-pay | Admitting: Gastroenterology

## 2023-09-15 NOTE — Progress Notes (Signed)
 Luis York,  The polyp which I removed during your recent procedure was proven to be completely benign but is considered a "pre-cancerous" polyp that MAY have grown into cancer if it had not been removed.  Studies shows that at least 20% of women over age 51 and 30% of men over age 75 have pre-cancerous polyps.  Based on current nationally recognized surveillance guidelines, I recommend that you have a repeat colonoscopy in 7 years.   If you develop any new rectal bleeding, abdominal pain or significant bowel habit changes, please contact me before then.

## 2023-10-10 ENCOUNTER — Encounter: Payer: Self-pay | Admitting: Urology

## 2023-10-10 ENCOUNTER — Ambulatory Visit: Payer: BC Managed Care – PPO | Admitting: Urology

## 2023-10-10 VITALS — BP 152/84 | HR 81

## 2023-10-10 DIAGNOSIS — N5201 Erectile dysfunction due to arterial insufficiency: Secondary | ICD-10-CM

## 2023-10-10 MED ORDER — SILDENAFIL CITRATE 100 MG PO TABS: 100.0000 mg | ORAL_TABLET | ORAL | 11 refills | Status: AC | PRN

## 2023-10-10 MED ORDER — SILDENAFIL CITRATE 100 MG PO TABS: 100.0000 mg | ORAL_TABLET | ORAL | 11 refills | Status: DC | PRN

## 2023-10-10 NOTE — Progress Notes (Signed)
 10/10/2023 9:07 AM   Luis York 03/31/1971 [id number]  Referring provider: Orlena Bitters, MD [address] [place],  [place] [address]  For erectile dysfunction   HPI: Luis York is a 52yo here for followup for erectile dysfunction. He was having chest pain prior to his colonoscopy and is currently scheduled to see cardiology 12/09/2023. HgA1c is 7.4. He is using sildenafil  80-100mg  with mixed results.    PMH: Past Medical History:  Diagnosis Date   Diabetes mellitus without complication (HCC)    Erectile dysfunction    Heartburn    Sleep apnea     Surgical History: No past surgical history on file.  Home Medications:  Allergies as of 10/10/2023   No Known Allergies      Medication List        Accurate as of Oct 10, 2023  9:07 AM. If you have any questions, ask your nurse or doctor.          glipiZIDE-metformin 5-500 MG tablet Commonly known as: METAGLIP Take 0.5 tablets by mouth. 2 in am; 1 in evening   omeprazole 20 MG capsule Commonly known as: PRILOSEC Take 20 mg by mouth daily.   OneTouch Verio test strip Generic drug: glucose blood 1 each 2 (two) times daily.   rosuvastatin 10 MG tablet Commonly known as: CRESTOR Take 10 mg by mouth daily.   sildenafil  20 MG tablet Commonly known as: REVATIO  Take 1-3 tablets (20-60 mg total) by mouth as needed.   valsartan  80 MG tablet Commonly known as: Diovan  Take 1 tablet (80 mg total) by mouth daily. What changed: how much to take        Allergies: No Known Allergies  Family History: Family History  Problem Relation Age of Onset   Heart disease Mother    Diabetes Mother    Heart attack Father    High blood pressure Father    Diabetes Father    Cancer Father    Stroke Paternal Grandmother    Heart disease Paternal Grandmother    Colon cancer Neg Hx    Esophageal cancer Neg Hx    Rectal cancer Neg Hx    Stomach cancer Neg Hx     Social History:  reports that he quit smoking about 27 years ago. His  smoking use included cigarettes. He has never used smokeless tobacco. He reports that he does not drink alcohol and does not use drugs.  ROS: All other review of systems were reviewed and are negative except what is noted above in HPI  Physical Exam: BP (!) 152/84   Pulse 81   Constitutional:  Alert and oriented, No acute distress. HEENT: [place] AT, moist mucus membranes.  Trachea midline, no masses. Cardiovascular: No clubbing, cyanosis, or edema. Respiratory: Normal respiratory effort, no increased work of breathing. GI: Abdomen is soft, nontender, nondistended, no abdominal masses GU: No CVA tenderness.  Lymph: No cervical or inguinal lymphadenopathy. Skin: No rashes, bruises or suspicious lesions. Neurologic: Grossly intact, no focal deficits, moving all 4 extremities. Psychiatric: Normal mood and affect.  Laboratory Data: No results found for: "WBC", "HGB", "HCT", "MCV", "PLT"  No results found for: "CREATININE"  No results found for: "PSA"  No results found for: "TESTOSTERONE"  No results found for: "HGBA1C"  Urinalysis    Component Value Date/Time   APPEARANCEUR Clear 09/12/2021 0917   GLUCOSEU 3+ (A) 09/12/2021 0917   BILIRUBINUR Negative 09/12/2021 0917   PROTEINUR Negative 09/12/2021 0917   NITRITE Negative 09/12/2021 0917  LEUKOCYTESUR Negative 09/12/2021 0981    Lab Results  Component Value Date   LABMICR Comment 09/12/2021    Pertinent Imaging:  No results found for this or any previous visit.  No results found for this or any previous visit.  No results found for this or any previous visit.  No results found for this or any previous visit.  No results found for this or any previous visit.  No results found for this or any previous visit.  No results found for this or any previous visit.  No results found for this or any previous visit.   Assessment & Plan:    1. Erectile dysfunction due to arterial insufficiency (Primary) Referral to  [facility] for Penile shock wave therapy Continue sildenafil  100mg  prn   No follow-ups on file.  Johnie Nailer, MD  [facility] [place]

## 2023-10-10 NOTE — Addendum Note (Signed)
 Addended by: Kierstynn Babich L on: 10/10/2023 09:23 AM   Modules accepted: Orders

## 2023-10-10 NOTE — Patient Instructions (Signed)

## 2023-12-02 NOTE — Progress Notes (Signed)
Updated surgical hx.

## 2023-12-09 ENCOUNTER — Encounter: Payer: Self-pay | Admitting: Internal Medicine

## 2023-12-09 ENCOUNTER — Telehealth: Payer: Self-pay | Admitting: Internal Medicine

## 2023-12-09 ENCOUNTER — Ambulatory Visit: Attending: Internal Medicine | Admitting: Internal Medicine

## 2023-12-09 VITALS — BP 158/92 | HR 80 | Ht 70.0 in | Wt 244.4 lb

## 2023-12-09 DIAGNOSIS — I10 Essential (primary) hypertension: Secondary | ICD-10-CM | POA: Insufficient documentation

## 2023-12-09 DIAGNOSIS — R079 Chest pain, unspecified: Secondary | ICD-10-CM | POA: Insufficient documentation

## 2023-12-09 MED ORDER — AMLODIPINE BESYLATE 5 MG PO TABS: 5.0000 mg | ORAL_TABLET | Freq: Every day | ORAL | 3 refills | Status: AC

## 2023-12-09 NOTE — Patient Instructions (Addendum)
 Medication Instructions:  Your physician has recommended you make the following change in your medication:  Start Amlodipine 5 mg once daily  Continue taking all other medications as prescribed  Labwork: None  Testing/Procedures: Your physician has requested that you have en exercise stress myoview. For further information please visit [email or web address] Please follow instruction sheet, as given.   Follow-Up: Your physician recommends that you schedule a follow-up appointment in: Pending Results  Any Other Special Instructions Will Be Listed Below (If Applicable). Thank you for choosing [facility]!     If you need a refill on your cardiac medications before your next appointment, please call your pharmacy.

## 2023-12-09 NOTE — Telephone Encounter (Signed)
 Checking percert on the following patient for testing scheduled at [facility].    Myoview Exercise Dx: Chest Pain   01/12/2024

## 2023-12-09 NOTE — Progress Notes (Signed)
 Cardiology Office Note  Date: 12/09/2023   ID: [name], DOB [birth year], MRN [id number]  PCP:  Rosamond Leta NOVAK, MD  Cardiologist:  Kalen Neidert P Kevon Tench, MD Electrophysiologist:  None   History of Present Illness: Luis York is a 53 y.o. male  Referred to cardiology clinic for evaluation of chest pain.  Home blood pressures and office blood pressures are elevated.  Home BP ranged around 142 150 mm SBP.  Clinic BP today is 158/92 mmHg.  Currently on valsartan  160 mg once daily.  Initially he was started on valsartan  80 mg once daily that was increased to 160 mg once daily by his PCP around 3 months ago.  He did notice having pains in his chest and radiating to his left arm and left neck around the time when he talks to his mother over the phone.  He thinks this could be from stress.  He works out, walks a lot and does not have any chest pain or left arm pain or neck pain with these exertional activities it is only when he receives a phone call from his mother, he notices the symptoms.  He does not have any other symptoms of DOE, dizziness, syncope, leg swelling.  Past Medical History:  Diagnosis Date   Diabetes mellitus without complication (HCC)    Erectile dysfunction    Heartburn    Sleep apnea     Past Surgical History:  Procedure Laterality Date   VASECTOMY      Current Outpatient Medications  Medication Sig Dispense Refill   glipiZIDE-metformin (METAGLIP) 5-500 MG tablet Take 0.5 tablets by mouth. 2 in am; 1 in evening     omeprazole (PRILOSEC) 20 MG capsule Take 20 mg by mouth daily.     ONETOUCH VERIO test strip 1 each 2 (two) times daily.     rosuvastatin (CRESTOR) 10 MG tablet Take 10 mg by mouth daily.     sildenafil  (VIAGRA ) 100 MG tablet Take 1 tablet (100 mg total) by mouth as needed for erectile dysfunction. 30 tablet 11   tadalafil (CIALIS) 5 MG tablet Take 5 mg by mouth daily.     valsartan  (DIOVAN ) 160 MG tablet Take 160 mg by mouth daily.     valsartan  (DIOVAN )  80 MG tablet Take 1 tablet (80 mg total) by mouth daily. (Patient taking differently: Take 160 mg by mouth daily.) 30 tablet 11   Current Facility-Administered Medications  Medication Dose Route Frequency Provider Last Rate Last Admin   0.9 %  sodium chloride  infusion  500 mL Intravenous Once Cunningham, Scott E, MD       Allergies:  Patient has no known allergies.   Social History: The patient  reports that he quit smoking about 27 years ago. His smoking use included cigarettes. He has never used smokeless tobacco. He reports that he does not drink alcohol and does not use drugs.   Family History: The patient's family history includes Cancer in his father; Diabetes in his father and mother; Heart attack in his father; Heart disease in his mother and paternal grandmother; High blood pressure in his father; Stroke in his paternal grandmother.   ROS:  Please see the history of present illness. Otherwise, complete review of systems is positive for none  All other systems are reviewed and negative.   Physical Exam: VS:  BP (!) 164/92   Pulse 80   Ht 5' 10 (1.778 m)   Wt 244 lb 6.4 oz (110.9 kg)   SpO2 98%  BMI 35.07 kg/m , BMI Body mass index is 35.07 kg/m.  Wt Readings from Last 3 Encounters:  12/09/23 244 lb 6.4 oz (110.9 kg)  09/05/23 245 lb (111.1 kg)  08/08/23 245 lb (111.1 kg)    General: Patient appears comfortable at rest. HEENT: Conjunctiva and lids normal, oropharynx clear with moist mucosa. Neck: Supple, no elevated JVP or carotid bruits, no thyromegaly. Lungs: Clear to auscultation, nonlabored breathing at rest. Cardiac: Regular rate and rhythm, no S3 or significant systolic murmur, no pericardial rub. Abdomen: Soft, nontender, no hepatomegaly, bowel sounds present, no guarding or rebound. Extremities: No pitting edema, distal pulses 2+. Skin: Warm and dry. Musculoskeletal: No kyphosis. Neuropsychiatric: Alert and oriented x3, affect grossly appropriate.  Recent  Labwork: No results found for requested labs within last 365 days.  No results found for: CHOL, TRIG, HDL, CHOLHDL, VLDL, LDLCALC, LDLDIRECT  Other Studies Reviewed Today:   Assessment and Plan:  HTN, poorly controlled: Home BP ranged around 140 to 150 mm increase BP and clinic BP is around 158 mmHg SBP.  Continue valsartan  160 mg once daily and start amlodipine 5 mg once daily.  Check BP in a.m. and p.m.  Avoid stress.  Can follow-up with PCP for HTN management.  Chest pain: Likely secondary to stress or HTN.  Unlikely ischemia.  He does workout and walk few miles with no symptoms of angina or DOE.  It is only when he talks to his mother he develops pain across his chest, left neck and left arm.  His blood pressures will need to be better controlled prior to scheduling him for exercise Myoview.  Will schedule him for the stress test in 1 month provided his blood pressures at rest are less than 140 mmHg SBP.  His cardiac risk factors include HTN, DM 2.       Medication Adjustments/Labs and Tests Ordered: Current medicines are reviewed at length with the patient today.  Concerns regarding medicines are outlined above.    Disposition:  Follow up pending results  Signed Anetha Slagel Priya Gearld Kerstein, MD, 12/09/2023 2:09 PM    [facility] at [facility] [address] [place], [place], [place] [address]

## 2024-01-12 ENCOUNTER — Encounter (HOSPITAL_COMMUNITY): Payer: Self-pay

## 2024-01-12 ENCOUNTER — Ambulatory Visit (HOSPITAL_COMMUNITY)
Admission: RE | Admit: 2024-01-12 | Discharge: 2024-01-12 | Disposition: A | Discharge status: Home / Self Care | Source: Ambulatory Visit | Attending: Internal Medicine | Admitting: Internal Medicine

## 2024-01-12 ENCOUNTER — Ambulatory Visit (HOSPITAL_COMMUNITY): Admission: RE | Admit: 2024-01-12 | Source: Ambulatory Visit

## 2024-01-12 DIAGNOSIS — R079 Chest pain, unspecified: Secondary | ICD-10-CM | POA: Insufficient documentation

## 2024-01-13 ENCOUNTER — Encounter (HOSPITAL_COMMUNITY): Payer: Self-pay

## 2024-01-13 ENCOUNTER — Other Ambulatory Visit: Payer: Self-pay | Admitting: Physician Assistant

## 2024-01-13 ENCOUNTER — Ambulatory Visit (HOSPITAL_COMMUNITY)
Admission: RE | Admit: 2024-01-13 | Discharge: 2024-01-13 | Disposition: A | Discharge status: Home / Self Care | Source: Ambulatory Visit | Attending: Internal Medicine | Admitting: Internal Medicine

## 2024-01-13 ENCOUNTER — Ambulatory Visit (HOSPITAL_BASED_OUTPATIENT_CLINIC_OR_DEPARTMENT_OTHER)
Admission: RE | Admit: 2024-01-13 | Discharge: 2024-01-13 | Disposition: A | Discharge status: Home / Self Care | Source: Ambulatory Visit | Attending: Internal Medicine | Admitting: Internal Medicine

## 2024-01-13 DIAGNOSIS — R079 Chest pain, unspecified: Secondary | ICD-10-CM

## 2024-01-13 DIAGNOSIS — I252 Old myocardial infarction: Secondary | ICD-10-CM | POA: Insufficient documentation

## 2024-01-13 MED ORDER — SODIUM CHLORIDE FLUSH 0.9 % IV SOLN
INTRAVENOUS | Status: AC
  Administered 2024-01-13: 10 mL via INTRAVENOUS
  Filled 2024-01-13: qty 10

## 2024-01-13 MED ORDER — TECHNETIUM TC 99M TETROFOSMIN IV KIT
10.0000 | PACK | Freq: Once | INTRAVENOUS | Status: AC | PRN
  Administered 2024-01-13: 10.3 via INTRAVENOUS

## 2024-01-13 MED ORDER — REGADENOSON 0.4 MG/5ML IV SOLN
INTRAVENOUS | Status: AC
  Filled 2024-01-13: qty 5

## 2024-01-13 MED ORDER — TECHNETIUM TC 99M TETROFOSMIN IV KIT
30.0000 | PACK | Freq: Once | INTRAVENOUS | Status: AC | PRN
  Administered 2024-01-13: 30 via INTRAVENOUS

## 2024-01-13 NOTE — Progress Notes (Signed)
     Luis York presented for a Exercise myoview  nuclear stress test today.  I Lorette CINDERELLA Kapur, PA-C, provided direct supervision and was present during the stress portion of the study today, which was completed without significant symptoms, immediate complications, or acute ST/T changes on ECG.  Stress imaging is pending at this time.  Preliminary ECG findings may be listed in the chart, but the stress test result will not be finalized until perfusion imaging is complete.  Lorette CINDERELLA Kapur, PA-C  01/13/2024, 10:19 AM

## 2024-01-14 LAB — NM MYOCAR MULTI W/SPECT W/WALL MOTION / EF
Angina Index: 0
Base ST Depression (mm): 0 mm
Duke Treadmill Score: 2
Estimated workload: 9.4
Exercise duration (min): 6 min
Exercise duration (sec): 49 s
LV dias vol: 118 mL (ref 62–150)
LV sys vol: 54 mL (ref 4.2–5.8)
MPHR: 170 {beats}/min
Nuc Stress EF: 54 %
Peak HR: 155 {beats}/min
Percent HR: 91 %
RATE: 0.4
RPE: 12
Rest HR: 75 {beats}/min
Rest Nuclear Isotope Dose: 10.3 mCi
SDS: 2
SRS: 3
SSS: 5
ST Depression (mm): 1 mm
Stress Nuclear Isotope Dose: 30 mCi
TID: 1.09

## 2024-01-15 ENCOUNTER — Ambulatory Visit: Payer: Self-pay | Admitting: Internal Medicine

## 2024-01-15 DIAGNOSIS — R079 Chest pain, unspecified: Secondary | ICD-10-CM

## 2024-01-15 DIAGNOSIS — R9439 Abnormal result of other cardiovascular function study: Secondary | ICD-10-CM

## 2024-01-15 MED ORDER — METOPROLOL TARTRATE 100 MG PO TABS: ORAL_TABLET | ORAL | 0 refills | Status: AC

## 2024-01-15 NOTE — Telephone Encounter (Addendum)
-----   Message from Vishnu P Mallipeddi sent at 01/15/2024  3:55 PM EDT ----- Stress test is abnormal.  Obtain CT cardiac. ----- Message ----- From: Alvan Dorn FALCON, MD Sent: 01/14/2024  12:53 PM EDT To: Vishnu P Mallipeddi, MD   I spoke with patient and he will get coronary ct, bmet tomorrow at [facility]   I will send instructions thru MyChart   I have asked Dr.Mallipeddi to speak with patient as he has many questions regarding test results.

## 2024-01-16 ENCOUNTER — Other Ambulatory Visit (HOSPITAL_COMMUNITY)
Admission: RE | Admit: 2024-01-16 | Discharge: 2024-01-16 | Disposition: A | Discharge status: Home / Self Care | Source: Ambulatory Visit | Attending: Internal Medicine | Admitting: Internal Medicine

## 2024-01-16 DIAGNOSIS — R9439 Abnormal result of other cardiovascular function study: Secondary | ICD-10-CM | POA: Insufficient documentation

## 2024-01-16 DIAGNOSIS — R079 Chest pain, unspecified: Secondary | ICD-10-CM | POA: Diagnosis present

## 2024-01-16 LAB — BASIC METABOLIC PANEL WITH GFR
Anion gap: 9 (ref 5–15)
BUN: 13 mg/dL (ref 6–20)
CO2: 26 mmol/L (ref 22–32)
Calcium: 9.3 mg/dL (ref 8.9–10.3)
Chloride: 102 mmol/L (ref 98–111)
Creatinine, Ser: 0.94 mg/dL (ref 0.61–1.24)
GFR, Estimated: >60 mL/min (ref >60)
Glucose, Bld: 270 mg/dL — ABNORMAL HIGH (ref 70–99)
Potassium: 4 mmol/L (ref 3.5–5.1)
Sodium: 137 mmol/L (ref 135–145)

## 2024-01-21 ENCOUNTER — Ambulatory Visit (HOSPITAL_COMMUNITY): Admission: RE | Admit: 2024-01-21 | Source: Ambulatory Visit

## 2024-01-23 ENCOUNTER — Ambulatory Visit (HOSPITAL_COMMUNITY)
Admission: RE | Admit: 2024-01-23 | Discharge: 2024-01-23 | Disposition: A | Discharge status: Home / Self Care | Source: Ambulatory Visit | Attending: Internal Medicine | Admitting: Internal Medicine

## 2024-01-23 DIAGNOSIS — I2584 Coronary atherosclerosis due to calcified coronary lesion: Secondary | ICD-10-CM | POA: Insufficient documentation

## 2024-01-23 DIAGNOSIS — I2510 Atherosclerotic heart disease of native coronary artery without angina pectoris: Secondary | ICD-10-CM | POA: Diagnosis not present

## 2024-01-23 DIAGNOSIS — R079 Chest pain, unspecified: Secondary | ICD-10-CM | POA: Insufficient documentation

## 2024-01-23 DIAGNOSIS — R9439 Abnormal result of other cardiovascular function study: Secondary | ICD-10-CM | POA: Diagnosis present

## 2024-01-23 MED ORDER — NITROGLYCERIN 0.4 MG SL SUBL
0.8000 mg | SUBLINGUAL_TABLET | Freq: Once | SUBLINGUAL | Status: AC
  Administered 2024-01-23: 0.8 mg via SUBLINGUAL

## 2024-01-23 MED ORDER — IOHEXOL 350 MG/ML SOLN
100.0000 mL | Freq: Once | INTRAVENOUS | Status: AC | PRN
  Administered 2024-01-23: 100 mL via INTRAVENOUS

## 2024-01-25 ENCOUNTER — Ambulatory Visit (HOSPITAL_BASED_OUTPATIENT_CLINIC_OR_DEPARTMENT_OTHER)
Admission: RE | Admit: 2024-01-25 | Discharge: 2024-01-25 | Disposition: A | Discharge status: Home / Self Care | Source: Ambulatory Visit | Attending: Cardiology

## 2024-01-25 ENCOUNTER — Other Ambulatory Visit: Payer: Self-pay | Admitting: Cardiology

## 2024-01-25 DIAGNOSIS — I2510 Atherosclerotic heart disease of native coronary artery without angina pectoris: Secondary | ICD-10-CM | POA: Diagnosis not present

## 2024-01-25 DIAGNOSIS — R931 Abnormal findings on diagnostic imaging of heart and coronary circulation: Secondary | ICD-10-CM

## 2024-01-27 ENCOUNTER — Ambulatory Visit: Payer: Self-pay | Admitting: Internal Medicine

## 2024-01-29 MED ORDER — ROSUVASTATIN CALCIUM 20 MG PO TABS: 20.0000 mg | ORAL_TABLET | Freq: Every day | ORAL | 3 refills | Status: AC

## 2024-01-29 NOTE — Telephone Encounter (Signed)
-----   Message from Vishnu P Mallipeddi sent at 01/27/2024  2:00 PM EDT ----- Coronary calcium  score is 1398 (99th percentile for age and sex matched control), total plaque volume is extensive, moderate stenosis in the mid to distal LAD and proximal RCA (all the RCA is small  and nondominant).  CT FFR suggested nonobstructive CAD, RCA not modeled due to small size.  Start aspirin 81 mg once daily, increase rosuvastatin  from 10 mg to 20 mg nightly.  Goal LDL less than 55 (due to extensive total plaque volume).  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   Chest pain noncardiac.  If patient requests, can schedule follow-up visit in 4 to 6 weeks to discuss results. ----- Message ----- From: Kate Lonni CROME, MD Sent: 01/25/2024   5:46 PM EDT To: Diannah SHAUNNA Maywood, MD   ----- Message ----- From: Interface, Rad Results In Sent: 01/25/2024   5:38 PM EDT To: Lonni CROME Kate, MD

## 2024-01-29 NOTE — Telephone Encounter (Signed)
 The patient has been notified of the result and verbalized understanding.  All questions (if any) were answered. Patient will call if he has any further issues and didn't need to make an appointment at this time. Littie CHRISTELLA Croak, CMA 01/29/2024 4:35 PM

## 2024-01-29 NOTE — Telephone Encounter (Signed)
 Patient returned staff call regarding results.

## 2024-06-21 ENCOUNTER — Telehealth: Payer: Self-pay

## 2024-06-21 NOTE — Telephone Encounter (Signed)
 Pharmacist from [facility] called in today to make Dr. Sherrilee aware pt is getting Sildenafil  100 mg PRN  prescribe from him and also getting Tadalafil 5 mg daily from Dr. Baldwin at [facility].  Verbal from Dr. Sherrilee can take both Rx, one PRN and the other Rx daily. From office note 10/10/2023, Erectile dysfunction due to arterial insufficiency (Primary) Referral to [facility] for Penile shock wave therapy Continue sildenafil  100mg  prn.

## 2024-10-08 ENCOUNTER — Ambulatory Visit: Admitting: Urology
